# Patient Record
Sex: Female | Born: 1970 | Race: Black or African American | Hispanic: No | Marital: Single | Smoking: Never smoker
Health system: Southern US, Community
[De-identification: ages and names within clinical notes are randomized; demographics above are authoritative.]

## PROBLEM LIST (undated history)

## (undated) DIAGNOSIS — F419 Anxiety disorder, unspecified: Secondary | ICD-10-CM

## (undated) DIAGNOSIS — T7840XA Allergy, unspecified, initial encounter: Secondary | ICD-10-CM

## (undated) DIAGNOSIS — D241 Benign neoplasm of right breast: Secondary | ICD-10-CM

## (undated) DIAGNOSIS — S80211A Abrasion, right knee, initial encounter: Secondary | ICD-10-CM

## (undated) DIAGNOSIS — I10 Essential (primary) hypertension: Secondary | ICD-10-CM

## (undated) DIAGNOSIS — K635 Polyp of colon: Secondary | ICD-10-CM

## (undated) DIAGNOSIS — M26629 Arthralgia of temporomandibular joint, unspecified side: Secondary | ICD-10-CM

## (undated) HISTORY — DX: Allergy, unspecified, initial encounter: T78.40XA

## (undated) HISTORY — DX: Anxiety disorder, unspecified: F41.9

## (undated) HISTORY — DX: Polyp of colon: K63.5

## (undated) HISTORY — DX: Essential (primary) hypertension: I10

## (undated) HISTORY — PX: TOE SURGERY: SHX1073

---

## 2000-08-25 ENCOUNTER — Other Ambulatory Visit: Admission: RE | Admit: 2000-08-25 | Discharge: 2000-08-25 | Payer: Self-pay | Admitting: Obstetrics and Gynecology

## 2001-03-04 ENCOUNTER — Emergency Department (HOSPITAL_COMMUNITY): Admission: EM | Admit: 2001-03-04 | Discharge: 2001-03-04 | Payer: Self-pay | Admitting: Emergency Medicine

## 2001-08-09 ENCOUNTER — Other Ambulatory Visit: Admission: RE | Admit: 2001-08-09 | Discharge: 2001-08-09 | Payer: Self-pay | Admitting: Obstetrics and Gynecology

## 2002-08-15 ENCOUNTER — Other Ambulatory Visit: Admission: RE | Admit: 2002-08-15 | Discharge: 2002-08-15 | Payer: Self-pay | Admitting: Obstetrics and Gynecology

## 2004-03-03 ENCOUNTER — Ambulatory Visit: Payer: Self-pay | Admitting: Pediatrics

## 2004-03-03 ENCOUNTER — Inpatient Hospital Stay (HOSPITAL_COMMUNITY): Admission: AD | Admit: 2004-03-03 | Discharge: 2004-03-12 | Payer: Self-pay | Admitting: Obstetrics and Gynecology

## 2004-03-08 ENCOUNTER — Encounter (INDEPENDENT_AMBULATORY_CARE_PROVIDER_SITE_OTHER): Payer: Self-pay | Admitting: Specialist

## 2004-03-13 ENCOUNTER — Encounter: Admission: RE | Admit: 2004-03-13 | Discharge: 2004-04-12 | Payer: Self-pay | Admitting: Obstetrics and Gynecology

## 2004-03-14 ENCOUNTER — Inpatient Hospital Stay (HOSPITAL_COMMUNITY): Admission: AD | Admit: 2004-03-14 | Discharge: 2004-03-17 | Payer: Self-pay | Admitting: Obstetrics and Gynecology

## 2004-04-13 ENCOUNTER — Encounter: Admission: RE | Admit: 2004-04-13 | Discharge: 2004-05-13 | Payer: Self-pay | Admitting: Obstetrics and Gynecology

## 2008-01-25 ENCOUNTER — Emergency Department (HOSPITAL_COMMUNITY): Admission: EM | Admit: 2008-01-25 | Discharge: 2008-01-25 | Payer: Self-pay | Admitting: Emergency Medicine
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
## 2010-07-24 NOTE — Discharge Summary (Signed)
[name], [name]              ACCOUNT NO.:  [id number]   MEDICAL RECORD NO.:  [id number]          PATIENT TYPE:  INP   LOCATION:  9316                          FACILITY:  [facility]   PHYSICIAN:  Lenoard Aden, M.D.DATE OF BIRTH:  11-16-1970   DATE OF ADMISSION:  03/03/2004  DATE OF DISCHARGE:  03/12/2004                                 DISCHARGE SUMMARY   The patient was admitted on March 03, 2004, for mild preeclampsia at 32+  weeks.   HOSPITAL COURSE:  She was managed expectantly with serial labs and fetal  heart rate monitoring with reassuring surveillance through the initial  course of her hospitalization.  On hospital day #6, it was noted that the  patient met criteria for severe preeclampsia based on 24-hour urine and  exacerbation of her liver function tests and blood pressure criteria.  Therefore, due to an unfavorable cervix and severe preeclampsia, primary  cesarean section was performed on March 08, 2004.  Postoperatively, the  patient's urine output improved on magnesium.  Blood pressures are slightly  labile and the patient was placed on labetalol.  She was discharged to home  on post day #4 after improvement of her blood pressure noted.   DISCHARGE MEDICATIONS:  1.  Hydralazine.  2.  Labetalol.  3.  Prenatal vitamins.  4.  Iron.  5.  Tylox.   FOLLOW UP:  She is to follow up in the office within one week for blood  pressure check, strict hypertensive precautions were given.  Discharge  teaching was done.   CONDITION ON DISCHARGE:  Stable.      RJT/MEDQ  D:  04/13/2004  T:  04/13/2004  Job:  [id number]

## 2010-07-24 NOTE — H&P (Signed)
NAMESAYLOR, [name]              ACCOUNT NO.:  [id number]   MEDICAL RECORD NO.:  [id number]          PATIENT TYPE:  INP   LOCATION:  9374                          FACILITY:  [facility]   PHYSICIAN:  Richardean Sale, M.D.   DATE OF BIRTH:  [birth year]   DATE OF ADMISSION:  03/14/2004  DATE OF DISCHARGE:                                HISTORY & PHYSICAL   ADMISSION DIAGNOSES:  Postpartum hypertension.   HISTORY OF PRESENT ILLNESS:  This is a 40 year old African-American female  gravida 1, para 1 who is status post primary cesarean section on March 08, 2004 for severe pre-eclampsia at [redacted] weeks gestation. The patient received  magnesium sulfate therapy for 24 hours after delivery. Blood pressures  improved. She was discharged to home on March 12, 2004 with prescription  for Labetalol and hydralazine p.o. The patient has been recording her blood  pressures at home and they have been elevated in the 160's to 180's over  100's to 1'teen's. The patient complains of mild headache at present. Denies  any visual changes or blurred vision. Denies any epigastric pain. Denies any  chest pain or shortness of breath.   PAST MEDICAL HISTORY:  No chronic illnesses.   PAST SURGICAL HISTORY:  Wisdom teeth and foot surgery.   OBSTETRIC HISTORY:  Status post cesarean section on March 08, 2004 for  severe pre-eclampsia at 33 weeks, which resulted in delivery of a viable  female infant who is currently at the NICU here at [facility].   GYNECOLOGIC HISTORY:  No history of abnormal pap smears or sexually  transmitted infections.   FAMILY HISTORY:  Significant for father with myocardial infarction, aunt  with multiple sclerosis, a grandparent with diabetes.   MEDICATIONS:  Labetalol 100 mg p.o. t.i.d., hydralazine 10 mg p.o. q.i.d.,  prenatal vitamin daily, Motrin and Percocet as needed.   ALLERGIES:  No known drug allergies.   PHYSICAL EXAMINATION:  VITAL SIGNS:  Blood pressure on admission  197/117,  repeat 199/117. Heart rate 84, respiratory rate 20, temperature 98.2.  GENERAL:  A well developed, well nourished, black female who appears in no  acute distress.  HEENT:  There is mild periorbital edema.  HEART:  Regular rate and rhythm. A 3 out of 6 systolic ejection murmur.  LUNGS:  Clear to auscultation bilaterally.  ABDOMEN:  Soft, nontender, and nondistended with no palpable masses. Liver  and spleen are normal.  EXTREMITIES:  2+ edema in the feet bilaterally. Nontender. Deep tendon  reflexes are 3 to 4+ bilaterally with 2 beats of clonus.   ASSESSMENT:  A 40 year old gravida 1, para 1, black female status post  primary cesarean section at 4 weeks on March 08, 2004 for severe pre-  eclampsia, now with worsening hypertension, headache, hyper-reflexia.  Clinical picture consistent with post partum pre-eclampsia.   PLAN:  Will admit to AICU. Start magnesium sulfate therapy for seizure  prophylaxis. Will administer intravenous antihypertensive. Monitor strict  urine output. Check pre-eclampsia labs and urine for protein. Reviewed with  patient and husband, increased risk of seizure and stroke with the same  elevated hypertension and  the need for hospitalization. The patient has  voiced understanding of the above plan. All questions answered.     Jenn   JW/MEDQ  D:  03/14/2004  T:  03/14/2004  Job:  [id number]

## 2010-07-24 NOTE — Op Note (Signed)
NAMELILLYEN, SCHOW              ACCOUNT NO.:  [id number]   MEDICAL RECORD NO.:  [id number]          PATIENT TYPE:  INP   LOCATION:  9154                          FACILITY:  [facility]   PHYSICIAN:  Lenoard Aden, M.D.DATE OF BIRTH:  [birth year]   DATE OF PROCEDURE:  03/09/2003  DATE OF DISCHARGE:                                 OPERATIVE REPORT   PREOPERATIVE DIAGNOSES:  1.  Severe preeclampsia.  2.  A 33-3/7 weeks intrauterine pregnancy.   POSTOPERATIVE DIAGNOSES:  1.  Severe preeclampsia.  2.  A 33-3/7 weeks intrauterine pregnancy.   PROCEDURE:  Primary LSTCS.   SURGEON:  Lenoard Aden, M.D.   ANESTHESIA:  Spinal by __________.   ESTIMATED BLOOD LOSS:  1000 mL.   COMPLICATIONS:  None.   DRAINS:  Foley.   COUNTS:  Correct.  The patient in recovery in good condition.  Magnesium sulfate to be started  in the recovery room.   FINDINGS:  Female fetus, to the NICU, Apgars 8 and 9.  Pediatricians in  attendance.  Placenta anteriorly located, delivered manually, intact.  Three-  vessel cord noted.  Bulb suctioning performed.  Normal tubes and normal  ovaries noted.   After achieving adequate anesthesia, the patient brought to the operating  room where she was administered a spinal anesthetic without complications,  prepped and draped in the usual sterile fashion, a Foley catheter placed.  After achieving adequate anesthesia, dilute Marcaine solution placed, 10 mL  total, Pfannenstiel skin incision made with the scalpel, carried down to the  fascia which is nicked in the midline, opened transversely using Mayo  scissors.  Rectus muscle is dissected sharply, peritoneum entered sharply,  bladder blade placed.  Visceroperitoneum is scored in a smile-like fashion,  dissected sharply off the lower uterine segment.  Kerr hysterotomy incision  made, atraumatically delivery of a female fetus, Apgars 8 and 9, pediatrics in  attendance, cord blood collected, cord pH collected 7.32.   Bulb suctioning  performed as noted.  Nuchal cord x 1, reduced prior to delivery.  Uterus is  curetted using a dry lap pack and closed in two layers using a 0 Monocryl  suture, and normal tubes and ovaries as previously noted.  Irrigation  accomplished.  Bladder flap is inspected and found to be hemostatic.  Two  interrupted sutures  placed in the midline of the uterine incision to obtain better hemostasis.  Interrupted figure-of-eight sutures.  At this time, good hemostasis is  noted.  Fascia has been closed with a 0 Monocryl in continuous running  fashion.  Skin closed using staples.  The patient tolerates the procedure  well and is transferred to recovery in good condition.      RJT/MEDQ  D:  03/08/2004  T:  03/08/2004  Job:  [id number]

## 2010-07-24 NOTE — Discharge Summary (Signed)
[name], [name]              ACCOUNT NO.:  [id number]   MEDICAL RECORD NO.:  [id number]          PATIENT TYPE:  INP   LOCATION:  9107                          FACILITY:  [facility]   PHYSICIAN:  Richardean Sale, M.D.   DATE OF BIRTH:  [birth year]   DATE OF ADMISSION:  03/14/2004  DATE OF DISCHARGE:                                 DISCHARGE SUMMARY   ADMITTING DIAGNOSIS:  Postpartum preeclampsia.   DISCHARGE DIAGNOSIS:  Postpartum preeclampsia.   HOSPITAL COURSE/HISTORY OF PRESENT ILLNESS:  This is a 40 year old African-  American female who is status post primary cesarean section on March 08, 2004 for severe preeclampsia at 33 weeks.  The patient received magnesium  sulfate therapy 24 hours after delivery and was discharged to home on  postoperative day #4 on labetalol and hydralazine to control hypertension.  The patient was monitoring her blood pressures at home.  She had blood  pressure elevations in the 180s over 120s and represented to [facility] on Saturday, January 7 complaining of headache.  Blood pressure on  admission was 197/117.  Given the significant hypertension, the patient was  readmitted to the AICU and started back on magnesium sulfate therapy.  She  also received intravenous labetalol to control significant hypertension.  After 24 hours of magnesium sulfate therapy the patient had had an adequate  diuresis, her headache had improved, and her blood pressures were less  labile.  She was continued on oral labetalol which was increased to 300 mg  p.o. t.i.d., and hydrochlorothiazide 25 mg p.o. daily was added.  Preeclampsia labs were drawn during this admission.  Her liver function  tests were mildly elevated with an AST of 42 and ALT of 59 but these were  decreased compared to her previous admission.  Uric acid was 6.5, LDH 145,  platelet count 387.  On hospital day #4 the patient's blood pressures were  significantly improved, running in the 130s over 80s.  She  had no headache,  visual changes, or epigastric pain and was subsequently discharged to home.   DISPOSITION:  To home.   CONDITION:  Improved.   MEDICATIONS:  1.  Labetalol 300 mg p.o. t.i.d.  2.  Hydrochlorothiazide 25 mg p.o. daily.  3.  Tylox one to two tablets p.o. q.4-6h. p.r.n. pain.  4.  Motrin 800 mg p.o. q.8h. p.r.n. pain.   INSTRUCTIONS:  The patient is to monitor her blood pressure daily.  She is  to call for blood pressures greater than 170/100 or less than 90/60, or if  she develops any headache, visual changes, or epigastric pain.  She is to  return to the office in 2-3 days for a blood pressure check.     Jenn   JW/MEDQ  D:  03/17/2004  T:  03/17/2004  Job:  [id number]

## 2015-12-24 ENCOUNTER — Other Ambulatory Visit: Payer: Self-pay | Admitting: Radiology

## 2016-01-09 ENCOUNTER — Ambulatory Visit: Payer: Self-pay | Admitting: General Surgery

## 2016-01-09 DIAGNOSIS — D241 Benign neoplasm of right breast: Secondary | ICD-10-CM

## 2016-02-06 DIAGNOSIS — D241 Benign neoplasm of right breast: Secondary | ICD-10-CM

## 2016-02-06 HISTORY — DX: Benign neoplasm of right breast: D24.1

## 2016-03-04 ENCOUNTER — Encounter (HOSPITAL_BASED_OUTPATIENT_CLINIC_OR_DEPARTMENT_OTHER): Payer: Self-pay | Admitting: *Deleted

## 2016-03-04 DIAGNOSIS — S80211A Abrasion, right knee, initial encounter: Secondary | ICD-10-CM

## 2016-03-04 HISTORY — DX: Abrasion, right knee, initial encounter: S80.211A

## 2016-03-10 ENCOUNTER — Ambulatory Visit (HOSPITAL_BASED_OUTPATIENT_CLINIC_OR_DEPARTMENT_OTHER): Payer: BLUE CROSS/BLUE SHIELD | Admitting: Certified Registered"

## 2016-03-10 ENCOUNTER — Encounter (HOSPITAL_BASED_OUTPATIENT_CLINIC_OR_DEPARTMENT_OTHER): Payer: Self-pay | Admitting: Certified Registered"

## 2016-03-10 ENCOUNTER — Ambulatory Visit (HOSPITAL_BASED_OUTPATIENT_CLINIC_OR_DEPARTMENT_OTHER)
Admission: RE | Admit: 2016-03-10 | Discharge: 2016-03-10 | Disposition: A | Discharge status: Home / Self Care | Payer: BLUE CROSS/BLUE SHIELD | Source: Ambulatory Visit | Attending: General Surgery | Admitting: General Surgery

## 2016-03-10 ENCOUNTER — Encounter (HOSPITAL_BASED_OUTPATIENT_CLINIC_OR_DEPARTMENT_OTHER)
Admission: RE | Disposition: A | Discharge status: Home / Self Care | Payer: Self-pay | Source: Ambulatory Visit | Attending: General Surgery

## 2016-03-10 DIAGNOSIS — D241 Benign neoplasm of right breast: Secondary | ICD-10-CM | POA: Insufficient documentation

## 2016-03-10 DIAGNOSIS — N63 Unspecified lump in unspecified breast: Secondary | ICD-10-CM | POA: Diagnosis present

## 2016-03-10 HISTORY — DX: Arthralgia of temporomandibular joint, unspecified side: M26.629

## 2016-03-10 HISTORY — DX: Abrasion, right knee, initial encounter: S80.211A

## 2016-03-10 HISTORY — PX: BREAST LUMPECTOMY WITH RADIOACTIVE SEED LOCALIZATION: SHX6424

## 2016-03-10 HISTORY — DX: Benign neoplasm of right breast: D24.1

## 2016-03-10 SURGERY — BREAST LUMPECTOMY WITH RADIOACTIVE SEED LOCALIZATION
Anesthesia: General | Site: Breast | Laterality: Right

## 2016-03-10 MED ORDER — DEXAMETHASONE SODIUM PHOSPHATE 10 MG/ML IJ SOLN
INTRAMUSCULAR | Status: AC
  Filled 2016-03-10: qty 1

## 2016-03-10 MED ORDER — ONDANSETRON HCL 4 MG/2ML IJ SOLN: 4.0000 mg | Freq: Once | INTRAMUSCULAR | Status: DC | PRN

## 2016-03-10 MED ORDER — LIDOCAINE 2% (20 MG/ML) 5 ML SYRINGE
INTRAMUSCULAR | Status: DC | PRN
  Administered 2016-03-10: 100 mg via INTRAVENOUS

## 2016-03-10 MED ORDER — BUPIVACAINE HCL (PF) 0.25 % IJ SOLN
INTRAMUSCULAR | Status: DC | PRN
  Administered 2016-03-10: 20 mL

## 2016-03-10 MED ORDER — CEFAZOLIN SODIUM-DEXTROSE 2-4 GM/100ML-% IV SOLN: 2.0000 g | INTRAVENOUS | Status: DC

## 2016-03-10 MED ORDER — LIDOCAINE 2% (20 MG/ML) 5 ML SYRINGE
INTRAMUSCULAR | Status: AC
  Filled 2016-03-10: qty 5

## 2016-03-10 MED ORDER — PROPOFOL 10 MG/ML IV BOLUS
INTRAVENOUS | Status: AC
  Filled 2016-03-10: qty 20

## 2016-03-10 MED ORDER — MEPERIDINE HCL 25 MG/ML IJ SOLN: 6.2500 mg | INTRAMUSCULAR | Status: DC | PRN

## 2016-03-10 MED ORDER — CEFAZOLIN SODIUM-DEXTROSE 2-4 GM/100ML-% IV SOLN
INTRAVENOUS | Status: AC
  Filled 2016-03-10: qty 100

## 2016-03-10 MED ORDER — CHLORHEXIDINE GLUCONATE CLOTH 2 % EX PADS: 6.0000 | MEDICATED_PAD | Freq: Once | CUTANEOUS | Status: DC

## 2016-03-10 MED ORDER — PROPOFOL 10 MG/ML IV BOLUS
INTRAVENOUS | Status: DC | PRN
  Administered 2016-03-10: 100 mg via INTRAVENOUS
  Administered 2016-03-10: 50 mg via INTRAVENOUS

## 2016-03-10 MED ORDER — SUCCINYLCHOLINE CHLORIDE 20 MG/ML IJ SOLN
INTRAMUSCULAR | Status: DC | PRN
  Administered 2016-03-10: 80 mg via INTRAVENOUS

## 2016-03-10 MED ORDER — HYDROCODONE-ACETAMINOPHEN 5-325 MG PO TABS: 1.0000 | ORAL_TABLET | ORAL | 0 refills | Status: DC | PRN

## 2016-03-10 MED ORDER — ONDANSETRON HCL 4 MG/2ML IJ SOLN
INTRAMUSCULAR | Status: DC | PRN
  Administered 2016-03-10: 4 mg via INTRAVENOUS

## 2016-03-10 MED ORDER — SCOPOLAMINE 1 MG/3DAYS TD PT72: 1.0000 | MEDICATED_PATCH | Freq: Once | TRANSDERMAL | Status: DC | PRN

## 2016-03-10 MED ORDER — DEXAMETHASONE SODIUM PHOSPHATE 4 MG/ML IJ SOLN
INTRAMUSCULAR | Status: DC | PRN
  Administered 2016-03-10: 10 mg via INTRAVENOUS

## 2016-03-10 MED ORDER — MIDAZOLAM HCL 2 MG/2ML IJ SOLN
1.0000 mg | INTRAMUSCULAR | Status: DC | PRN
  Administered 2016-03-10: 2 mg via INTRAVENOUS

## 2016-03-10 MED ORDER — ONDANSETRON HCL 4 MG/2ML IJ SOLN
INTRAMUSCULAR | Status: AC
  Filled 2016-03-10: qty 2

## 2016-03-10 MED ORDER — LACTATED RINGERS IV SOLN
INTRAVENOUS | Status: DC
  Administered 2016-03-10: 13:00:00 via INTRAVENOUS

## 2016-03-10 MED ORDER — HYDROMORPHONE HCL 1 MG/ML IJ SOLN: 0.2500 mg | INTRAMUSCULAR | Status: DC | PRN

## 2016-03-10 MED ORDER — MIDAZOLAM HCL 2 MG/2ML IJ SOLN
INTRAMUSCULAR | Status: AC
  Filled 2016-03-10: qty 2

## 2016-03-10 MED ORDER — FENTANYL CITRATE (PF) 100 MCG/2ML IJ SOLN
INTRAMUSCULAR | Status: AC
  Filled 2016-03-10: qty 2

## 2016-03-10 MED ORDER — FENTANYL CITRATE (PF) 100 MCG/2ML IJ SOLN
50.0000 ug | INTRAMUSCULAR | Status: DC | PRN
  Administered 2016-03-10: 100 ug via INTRAVENOUS

## 2016-03-10 SURGICAL SUPPLY — 46 items
ADH SKN CLS APL DERMABOND .7 (GAUZE/BANDAGES/DRESSINGS) ×1
APPLIER CLIP 9.375 MED OPEN (MISCELLANEOUS)
APR CLP MED 9.3 20 MLT OPN (MISCELLANEOUS)
BLADE SURG 15 STRL LF DISP TIS (BLADE) ×1 IMPLANT
BLADE SURG 15 STRL SS (BLADE) ×3
CANISTER SUC SOCK COL 7IN (MISCELLANEOUS) ×1 IMPLANT
CANISTER SUCT 1200ML W/VALVE (MISCELLANEOUS) ×1 IMPLANT
CHLORAPREP W/TINT 26ML (MISCELLANEOUS) ×3 IMPLANT
CLIP APPLIE 9.375 MED OPEN (MISCELLANEOUS) IMPLANT
COVER BACK TABLE 60X90IN (DRAPES) ×3 IMPLANT
COVER MAYO STAND STRL (DRAPES) ×3 IMPLANT
COVER PROBE W GEL 5X96 (DRAPES) ×3 IMPLANT
DECANTER SPIKE VIAL GLASS SM (MISCELLANEOUS) IMPLANT
DERMABOND ADVANCED (GAUZE/BANDAGES/DRESSINGS) ×2
DERMABOND ADVANCED .7 DNX12 (GAUZE/BANDAGES/DRESSINGS) ×1 IMPLANT
DEVICE DUBIN W/COMP PLATE 8390 (MISCELLANEOUS) ×3 IMPLANT
DRAPE LAPAROSCOPIC ABDOMINAL (DRAPES) IMPLANT
DRAPE UTILITY XL STRL (DRAPES) ×3 IMPLANT
ELECT COATED BLADE 2.86 ST (ELECTRODE) ×3 IMPLANT
ELECT REM PT RETURN 9FT ADLT (ELECTROSURGICAL) ×3
ELECTRODE REM PT RTRN 9FT ADLT (ELECTROSURGICAL) ×1 IMPLANT
GLOVE BIO SURGEON STRL SZ7.5 (GLOVE) ×6 IMPLANT
GLOVE BIOGEL PI IND STRL 7.0 (GLOVE) IMPLANT
GLOVE BIOGEL PI INDICATOR 7.0 (GLOVE) ×4
GLOVE ECLIPSE 6.5 STRL STRAW (GLOVE) ×2 IMPLANT
GOWN STRL REUS W/ TWL LRG LVL3 (GOWN DISPOSABLE) ×2 IMPLANT
GOWN STRL REUS W/TWL LRG LVL3 (GOWN DISPOSABLE) ×6
ILLUMINATOR WAVEGUIDE N/F (MISCELLANEOUS) IMPLANT
KIT MARKER MARGIN INK (KITS) ×3 IMPLANT
LIGHT WAVEGUIDE WIDE FLAT (MISCELLANEOUS) IMPLANT
NDL HYPO 25X1 1.5 SAFETY (NEEDLE) IMPLANT
NEEDLE HYPO 25X1 1.5 SAFETY (NEEDLE) ×3 IMPLANT
NS IRRIG 1000ML POUR BTL (IV SOLUTION) IMPLANT
PACK BASIN DAY SURGERY FS (CUSTOM PROCEDURE TRAY) ×3 IMPLANT
PENCIL BUTTON HOLSTER BLD 10FT (ELECTRODE) ×3 IMPLANT
SLEEVE SCD COMPRESS KNEE MED (MISCELLANEOUS) ×3 IMPLANT
SPONGE LAP 18X18 X RAY DECT (DISPOSABLE) ×3 IMPLANT
SUT MON AB 4-0 PC3 18 (SUTURE) ×2 IMPLANT
SUT SILK 2 0 SH (SUTURE) IMPLANT
SUT VICRYL 3-0 CR8 SH (SUTURE) ×3 IMPLANT
SYR CONTROL 10ML LL (SYRINGE) ×2 IMPLANT
TOWEL OR 17X24 6PK STRL BLUE (TOWEL DISPOSABLE) ×3 IMPLANT
TOWEL OR NON WOVEN STRL DISP B (DISPOSABLE) ×1 IMPLANT
TUBE CONNECTING 20'X1/4 (TUBING)
TUBE CONNECTING 20X1/4 (TUBING) ×1 IMPLANT
YANKAUER SUCT BULB TIP NO VENT (SUCTIONS) IMPLANT

## 2016-03-10 NOTE — Interval H&P Note (Signed)
History and Physical Interval Note:  [id number] PM  [name]  has presented today for surgery, with the diagnosis of RIGHT BREAST PAPILLOMA  The various methods of treatment have been discussed with the patient and family. After consideration of risks, benefits and other options for treatment, the patient has consented to  Procedure(s): RIGHT BREAST LUMPECTOMY WITH RADIOACTIVE SEED LOCALIZATION (Right) as a surgical intervention .  The patient's history has been reviewed, patient examined, no change in status, stable for surgery.  I have reviewed the patient's chart and labs.  Questions were answered to the patient's satisfaction.     TOTH III,PAUL S

## 2016-03-10 NOTE — Op Note (Signed)
03/10/2016  2:30 PM  PATIENT:  [name]  46 y.o. female  PRE-OPERATIVE DIAGNOSIS:  RIGHT BREAST PAPILLOMA  POST-OPERATIVE DIAGNOSIS:  RIGHT BREAST PAPILLOMA  PROCEDURE:  Procedure(s): RIGHT BREAST LUMPECTOMY WITH RADIOACTIVE SEED LOCALIZATION (Right)  SURGEON:  Surgeon(s) and Role:    * Jovita Kussmaul, MD - Primary  PHYSICIAN ASSISTANT:   ASSISTANTS: none   ANESTHESIA:   local and general  EBL:  Total I/O In: 200 [I.V.:200] Out: 15 [Blood:15]  BLOOD ADMINISTERED:none  DRAINS: none   LOCAL MEDICATIONS USED:  MARCAINE     SPECIMEN:  Source of Specimen:  right breast tissue  DISPOSITION OF SPECIMEN:  PATHOLOGY  COUNTS:  YES  TOURNIQUET:  * No tourniquets in log *  DICTATION: .Dragon Dictation   After informed consent was obtained the patient was brought to the operating room and placed in the supine position on the operating room table. After adequate induction of general anesthesia the patient's right breast was prepped with ChloraPrep, allowed to dry, and draped in usual sterile manner. An appropriate timeout was performed. Previously an I-125 seed was placed in the lower outer subareolar area of the right breast to mark an area of an intraductal papilloma. The neoprobe was sent to I-125 in the area of radioactivity was readily identified in the lower outer subareolar area of the right breast. A curvilinear incision was made along the lower outer edge of the areola with a 15 blade knife. The incision was carried through the subcutaneous tissue sharply with the electrocautery. Skin hooks were used to retract the skin edges radially. While checking the area of radioactivity frequently with the neoprobe a circular portion of breast tissue was excised sharply around the radioactive seed with the electrocautery. Once the specimen was removed it was oriented with the appropriate paint colors. A specimen radiograph was obtained that showed the clip and seed to be in the center  of the specimen. The specimen was then sent to pathology for further evaluation. The wound was infiltrated with quarter percent Marcaine. Hemostasis was achieved using the Bovie electrocautery. The deep layer of the wound was then closed with layers of interrupted 3-0 Vicryl stitches. Skin was then closed with interrupted 4-0 Monocryl subcuticular stitches. Dermabond dressings were applied. The patient tolerated the procedure well. At the end of the case all needle sponge and instrument counts were correct. The patient was then awakened and taken to recovery in stable condition.  PLAN OF CARE: Discharge to home after PACU  PATIENT DISPOSITION:  PACU - hemodynamically stable.   Delay start of Pharmacological VTE agent (>24hrs) due to surgical blood loss or risk of bleeding: not applicable

## 2016-03-10 NOTE — Transfer of Care (Signed)
Immediate Anesthesia Transfer of Care Note  Patient: [name]  Procedure(s) Performed: Procedure(s): RIGHT BREAST LUMPECTOMY WITH RADIOACTIVE SEED LOCALIZATION (Right)  Patient Location: PACU  Anesthesia Type:General  Level of Consciousness: sedated  Airway & Oxygen Therapy: Patient Spontanous Breathing and Patient connected to face mask oxygen  Post-op Assessment: Report given to RN and Post -op Vital signs reviewed and stable  Post vital signs: Reviewed and stable  Last Vitals:  Vitals:   03/10/16 1440 03/10/16 1441  BP:    Pulse: 98 95  Resp: (!) 21 15  Temp: 36.6 C     Last Pain:  Vitals:   03/10/16 1252  TempSrc: Oral         Complications: No apparent anesthesia complications

## 2016-03-10 NOTE — Anesthesia Postprocedure Evaluation (Signed)
Anesthesia Post Note  Patient: [name]  Procedure(s) Performed: Procedure(s) (LRB): RIGHT BREAST LUMPECTOMY WITH RADIOACTIVE SEED LOCALIZATION (Right)  Patient location during evaluation: PACU Anesthesia Type: General Level of consciousness: awake and alert Pain management: pain level controlled Vital Signs Assessment: post-procedure vital signs reviewed and stable Respiratory status: spontaneous breathing, nonlabored ventilation, respiratory function stable and patient connected to nasal cannula oxygen Cardiovascular status: blood pressure returned to baseline and stable Postop Assessment: no signs of nausea or vomiting Anesthetic complications: no       Last Vitals:  Vitals:   03/10/16 1441 03/10/16 1445  BP:  (!) 134/95  Pulse: 95 86  Resp: 15 14  Temp:      Last Pain:  Vitals:   03/10/16 1445  TempSrc:   PainSc: 0-No pain                 [name] DAVID

## 2016-03-10 NOTE — Anesthesia Procedure Notes (Signed)
Procedure Name: Intubation Date/Time: 03/10/2016 1:46 PM Performed by: Maryella Shivers Pre-anesthesia Checklist: Patient identified, Emergency Drugs available, Suction available and Patient being monitored Patient Re-evaluated:Patient Re-evaluated prior to inductionOxygen Delivery Method: Circle system utilized Preoxygenation: Pre-oxygenation with 100% oxygen Intubation Type: IV induction Ventilation: Mask ventilation without difficulty Laryngoscope Size: Mac and 3 Grade View: Grade II Tube type: Oral Tube size: 7.0 mm Number of attempts: 1 Airway Equipment and Method: Stylet and Oral airway Placement Confirmation: ETT inserted through vocal cords under direct vision,  positive ETCO2 and breath sounds checked- equal and bilateral Secured at: 20 cm Tube secured with: Tape Dental Injury: Teeth and Oropharynx as per pre-operative assessment  Comments: Unable to seat LMA, pt. intubated

## 2016-03-10 NOTE — Discharge Instructions (Signed)

## 2016-03-10 NOTE — H&P (Signed)
[name]  Location: [facility] Patient #: [id number] DOB: [birth year] Married / Language: English / Race: Black or African American Female   History of Present Illness  The patient is a 46 year old female who presents with a breast mass. We are asked to see the patient in consultation by Dr. Isaiah Blakes to evaluate her for a right breast intraductal papilloma. The patient is a 46 year old black female who recently went for a routine screening mammogram. At that time she was found to have an area of distortion in the 9:00 subareolar right breast. This was biopsied and came back as an intraductal papilloma. She denies any breast pain or discharge from the nipple. She does not take any hormone replacement. She has no personal or family history of breast cancer.   Other Problems  High blood pressure   Past Surgical History  Cesarean Section - 1   Diagnostic Studies History Colonoscopy  never Mammogram  within last year Pap Smear  1-5 years ago  Allergies  No Known Drug Allergies   Medication History  No Current Medications Medications Reconciled  Social History  Alcohol use  Occasional alcohol use. Caffeine use  Carbonated beverages, Coffee, Tea. No drug use  Tobacco use  Never smoker.  Family History  Arthritis  Father, Mother. Colon Polyps  Father. Heart Disease  Father. Hypertension  Father. Melanoma  Family Members In General.  Pregnancy / Birth History  Age at menarche  56 years. Age of menopause  <44 Contraceptive History  Oral contraceptives. Gravida  1 Irregular periods  Maternal age  8-35 Para  0    Review of Systems  General Not Present- Appetite Loss, Chills, Fatigue, Fever, Night Sweats, Weight Gain and Weight Loss. Skin Not Present- Change in Wart/Mole, Dryness, Hives, Jaundice, New Lesions, Non-Healing Wounds, Rash and Ulcer. HEENT Not Present- Earache, Hearing Loss, Hoarseness, Nose Bleed, Oral Ulcers,  Ringing in the Ears, Seasonal Allergies, Sinus Pain, Sore Throat, Visual Disturbances, Wears glasses/contact lenses and Yellow Eyes. Respiratory Not Present- Bloody sputum, Chronic Cough, Difficulty Breathing, Snoring and Wheezing. Breast Present- Breast Mass. Not Present- Breast Pain, Nipple Discharge and Skin Changes. Cardiovascular Not Present- Chest Pain, Difficulty Breathing Lying Down, Leg Cramps, Palpitations, Rapid Heart Rate, Shortness of Breath and Swelling of Extremities. Gastrointestinal Not Present- Abdominal Pain, Bloating, Bloody Stool, Change in Bowel Habits, Chronic diarrhea, Constipation, Difficulty Swallowing, Excessive gas, Gets full quickly at meals, Hemorrhoids, Indigestion, Nausea, Rectal Pain and Vomiting. Female Genitourinary Not Present- Frequency, Nocturia, Painful Urination, Pelvic Pain and Urgency. Musculoskeletal Not Present- Back Pain, Joint Pain, Joint Stiffness, Muscle Pain, Muscle Weakness and Swelling of Extremities. Neurological Not Present- Decreased Memory, Fainting, Headaches, Numbness, Seizures, Tingling, Tremor, Trouble walking and Weakness. Psychiatric Not Present- Anxiety, Bipolar, Change in Sleep Pattern, Depression, Fearful and Frequent crying. Endocrine Not Present- Cold Intolerance, Excessive Hunger, Hair Changes, Heat Intolerance, Hot flashes and New Diabetes. Hematology Not Present- Blood Thinners, Easy Bruising, Excessive bleeding, Gland problems, HIV and Persistent Infections.  Vitals  Weight: 140 lb Height: 61.5in Body Surface Area: 1.63 m Body Mass Index: 26.02 kg/m  Pulse: 77 (Regular)  BP: 126/74 (Sitting, Left Arm, Standard)       Physical Exam  General Mental Status-Alert. General Appearance-Consistent with stated age. Hydration-Well hydrated. Voice-Normal.  Head and Neck Head-normocephalic, atraumatic with no lesions or palpable masses. Trachea-midline. Thyroid Gland Characteristics - normal size and  consistency.  Eye Eyeball - Bilateral-Extraocular movements intact. Sclera/Conjunctiva - Bilateral-No scleral icterus.  Chest and Lung Exam Chest  and lung exam reveals -quiet, even and easy respiratory effort with no use of accessory muscles and on auscultation, normal breath sounds, no adventitious sounds and normal vocal resonance. Inspection Chest Wall - Normal. Back - normal.  Breast Note: There is a small palpable bruising in the lower outer subareolar right breast. There is no palpable mass in the left breast. There is no palpable axillary, supraclavicular, or cervical lymphadenopathy.   Cardiovascular Cardiovascular examination reveals -normal heart sounds, regular rate and rhythm with no murmurs and normal pedal pulses bilaterally.  Abdomen Inspection Inspection of the abdomen reveals - No Hernias. Skin - Scar - no surgical scars. Palpation/Percussion Palpation and Percussion of the abdomen reveal - Soft, Non Tender, No Rebound tenderness, No Rigidity (guarding) and No hepatosplenomegaly. Auscultation Auscultation of the abdomen reveals - Bowel sounds normal.  Neurologic Neurologic evaluation reveals -alert and oriented x 3 with no impairment of recent or remote memory. Mental Status-Normal.  Musculoskeletal Normal Exam - Left-Upper Extremity Strength Normal and Lower Extremity Strength Normal. Normal Exam - Right-Upper Extremity Strength Normal and Lower Extremity Strength Normal.  Lymphatic Head & Neck  General Head & Neck Lymphatics: Bilateral - Description - Normal. Axillary  General Axillary Region: Bilateral - Description - Normal. Tenderness - Non Tender. Femoral & Inguinal  Generalized Femoral & Inguinal Lymphatics: Bilateral - Description - Normal. Tenderness - Non Tender.    Assessment & Plan  INTRADUCTAL PAPILLOMA OF BREAST, RIGHT (D24.1) Impression: The patient appears to have an intraductal papilloma of the subareolar right  lateral breast. Although the papilloma is benign because of its appearance on mammogram I think it would be reasonable to remove it. She would also like to have this done. I have discussed with her in detail the risks and benefits of the operation to remove this area as well as some of the technical aspects and she understands and wishes to proceed. I will plan for a right breast radioactive seed localized lumpectomy. Current Plans Pt Education - Breast Diseases: discussed with patient and provided information.

## 2016-03-10 NOTE — Anesthesia Preprocedure Evaluation (Signed)

## 2016-03-11 ENCOUNTER — Encounter (HOSPITAL_BASED_OUTPATIENT_CLINIC_OR_DEPARTMENT_OTHER): Payer: Self-pay | Admitting: General Surgery

## 2016-09-22 ENCOUNTER — Encounter: Payer: Self-pay | Admitting: Neurology

## 2016-09-23 ENCOUNTER — Encounter: Payer: Self-pay | Admitting: Neurology

## 2016-09-23 ENCOUNTER — Ambulatory Visit (INDEPENDENT_AMBULATORY_CARE_PROVIDER_SITE_OTHER): Payer: BLUE CROSS/BLUE SHIELD | Admitting: Neurology

## 2016-09-23 ENCOUNTER — Encounter (INDEPENDENT_AMBULATORY_CARE_PROVIDER_SITE_OTHER): Payer: Self-pay

## 2016-09-23 VITALS — BP 135/76 | HR 73 | Ht 61.0 in | Wt 145.0 lb

## 2016-09-23 DIAGNOSIS — G478 Other sleep disorders: Secondary | ICD-10-CM

## 2016-09-23 DIAGNOSIS — R51 Headache: Secondary | ICD-10-CM

## 2016-09-23 DIAGNOSIS — G479 Sleep disorder, unspecified: Secondary | ICD-10-CM | POA: Diagnosis not present

## 2016-09-23 DIAGNOSIS — F419 Anxiety disorder, unspecified: Secondary | ICD-10-CM

## 2016-09-23 DIAGNOSIS — Z566 Other physical and mental strain related to work: Secondary | ICD-10-CM

## 2016-09-23 DIAGNOSIS — R351 Nocturia: Secondary | ICD-10-CM

## 2016-09-23 DIAGNOSIS — R519 Headache, unspecified: Secondary | ICD-10-CM

## 2016-09-23 NOTE — Patient Instructions (Addendum)
Based on your symptoms and your exam I believe you are not at hight risk for obstructive sleep apnea or OSA, but I think we should proceed with a sleep study to determine what is going on in your sleep at night. If you have more than mild OSA, I want you to consider treatment with CPAP.   Please remember, the risks and ramifications of moderate to severe obstructive sleep apnea or OSA are: Cardiovascular disease, including congestive heart failure, stroke, difficult to control hypertension, arrhythmias, and even type 2 diabetes has been linked to untreated OSA. Sleep apnea causes disruption of sleep and sleep deprivation in most cases, which, in turn, can cause recurrent headaches, problems with memory, mood, concentration, focus, and vigilance. Most people with untreated sleep apnea report excessive daytime sleepiness, which can affect their ability to drive. Please do not drive if you feel sleepy.   I will likely see you back after your sleep study to go over the test results and where to go from there. We will call you after your sleep study to advise about the results (most likely, you will hear from my nurse) and to set up an appointment at the time, as necessary.    Our sleep lab administrative assistant, Arrie Aran will meet with you or call you to schedule your sleep study. If you don't hear back from her by next week please feel free to call her at [phone]. This is her direct line and please leave a message with your phone number to call back if you get the voicemail box. She will call back as soon as possible.

## 2016-09-23 NOTE — Progress Notes (Signed)
Subjective:    Patient ID: [name] is a 46 y.o. female.  HPI     Star Age, MD, PhD [facility] [address] [place],  [address]  Dear Dr. Virgina Jock,   I saw your patient, [name], upon your kind request in my neurologic clinic today for initial consultation of her sleep disorder, in particular, concern for underlying obstructive sleep apnea. The patient is unaccompanied today. As you know, [name] is a 46 year old right-handed woman with an underlying medical history of allergic rhinitis, vitamin D deficiency, chondromalacia, preeclampsia, recent status lumpectomy right breast, and overweight state, who reports difficulty with sleep maintenance, waking up in the early morning hours with difficulty going back to sleep. I reviewed your office note from 08/13/2016, which you kindly included. Her Epworth sleepiness score is 5 out of 24 today, fatigue score is 48 out of 63. She lives with her husband and daughter, age 35. She works for [employer]. She is a nonsmoker, she does not drink alcohol frequently, maybe twice a month, no day-to-day caffeine, maybe once a week or so. She has been on trazodone 50 mg each night. She does report quite a bit of work related stress and anxiety at times. She has been using trazodone nearly nightly for the past month. She has no history of parasomnias, is a restless sleeper and a light sleeper and that has been going on for some time. She has no family history of restless leg syndrome and does not endorse much in the way of telltale restless leg symptoms. She has Had occasional morning headaches and has nocturia about once per average night. Bedtime is around 10 or 10:30 PM, wakeup time around 6:30 AM. Father has obstructive sleep apnea, mother died suddenly at age 15 with a pulmonary embolism. She does not watch TV in the bedroom. They have 1 dog in the bedroom area, typically not in bed  with them. She did not take her trazodone during her recent 5 day vacation and did not notice much in the way of difference without it. She does believe that part of her sleep difficulties are stress related. She is trying to workout on a regular basis, typically works out with a Clinical research associate. She is stable with her weight. She tries to meditate or do deep breathing exercises or yoga to relax.  Her Past Medical History Is Significant For: Past Medical History:  Diagnosis Date  . Abrasion of right knee 03/04/2016  . 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 TMJ syndrome     Her Past Surgical History Is Significant For: Past Surgical History:  Procedure Laterality Date  . BREAST LUMPECTOMY WITH RADIOACTIVE SEED LOCALIZATION Right 03/10/2016   Procedure: RIGHT BREAST LUMPECTOMY WITH RADIOACTIVE SEED LOCALIZATION;  Surgeon: Autumn Messing III, MD;  Location: [facility];  Service: General;  Laterality: Right;  . CESAREAN SECTION  03/08/2004  . TOE SURGERY Right    age 41    Her Family History Is Significant For: Family History  Problem Relation Age of Onset  . Pulmonary embolism Mother   . Melanoma Sister   . Heart disease Father     Her Social History Is Significant For: Social History   Social History  . Marital status: Single    Spouse name: N/A  . Number of children: N/A  . Years of education: N/A   Social History Main Topics  . Smoking status: Never Smoker  . Smokeless tobacco:  Never Used  . Alcohol use Yes     Comment: occasionally  . Drug use: No  . Sexual activity: Not Asked   Other Topics Concern  . None   Social History Narrative  . None    Her Allergies Are:  No Known Allergies:   Her Current Medications Are:  Outpatient Encounter Prescriptions as of 09/23/2016  Medication Sig  . Cholecalciferol (VITAMIN D-3) 1000 units CAPS Take 1 capsule by mouth 3 (three) times a week.  . Multiple Vitamin (MULTIVITAMIN) tablet Take 1 tablet by mouth daily.  .  traZODone (DESYREL) 50 MG tablet Take 50 mg by mouth at bedtime.  . [DISCONTINUED] HYDROcodone-acetaminophen (NORCO/VICODIN) 5-325 MG tablet Take 1-2 tablets by mouth every 4 (four) hours as needed for moderate pain or severe pain.   No facility-administered encounter medications on file as of 09/23/2016.   :  Review of Systems:  Out of a complete 14 point review of systems, all are reviewed and negative with the exception of these symptoms as listed below: Review of Systems  Neurological:       Pt presents today to discuss her sleep. Pt has no problem going to sleep but will wake up in the early morning hours unable to go back to sleep unless she does yoga or deep breathing exercises. Pt thinks anxiety and stress from her job plays a part in this. Pt has never had a sleep study and does not endorse snoring.  Epworth Sleepiness Scale 0= would never doze 1= slight chance of dozing 2= moderate chance of dozing 3= high chance of dozing  Sitting and reading: 1 Watching TV: 1 Sitting inactive in a public place (ex. Theater or meeting): 0 As a passenger in a car for an hour without a break: 1 Lying down to rest in the afternoon: 2 Sitting and talking to someone: 0 Sitting quietly after lunch (no alcohol): 0 In a car, while stopped in traffic: 0 Total: 5     Objective:  Neurological Exam  Physical Exam Physical Examination:   Vitals:   09/23/16 1106  BP: 135/76  Pulse: 73    General Examination: The patient is a very pleasant 46 y.o. female in no acute distress. She appears well-developed and well-nourished and well groomed.   HEENT: Normocephalic, atraumatic, pupils are equal, round and reactive to light and accommodation. Funduscopic exam is normal with sharp disc margins noted. Extraocular tracking is good without limitation to gaze excursion or nystagmus noted. Normal smooth pursuit is noted. Hearing is grossly intact. Face is symmetric with normal facial animation and normal  facial sensation. Speech is clear with no dysarthria noted. There is no hypophonia. There is no lip, neck/head, jaw or voice tremor. Neck is supple with full range of passive and active motion. There are no carotid bruits on auscultation. Oropharynx exam reveals: no mouth dryness, good dental hygiene and no significant airway crowding, but small airway entry, small uvula, tonsils are 1+, Mallampati is class I.she has a slender neck size of 12-3/4 inches. She has a minimal overbite. Nasal inspection shows no significant nasal mucosal bogginess or redness and mild septum deviation to the right.   Chest: Clear to auscultation without wheezing, rhonchi or crackles noted.  Heart: S1+S2+0, regular and normal without murmurs, rubs or gallops noted.   Abdomen: Soft, non-tender and non-distended with normal bowel sounds appreciated on auscultation.  Extremities: There is no pitting edema in the distal lower extremities bilaterally. Pedal pulses are intact.  Skin: Warm and  dry without trophic changes noted. There are no varicose veins.  Musculoskeletal: exam reveals no obvious joint deformities, tenderness or joint swelling or erythema.   Neurologically:  Mental status: The patient is awake, alert and oriented in all 4 spheres. Her immediate and remote memory, attention, language skills and fund of knowledge are appropriate. There is no evidence of aphasia, agnosia, apraxia or anomia. Speech is clear with normal prosody and enunciation. Thought process is linear. Mood is normal and affect is normal.  Cranial nerves II - XII are as described above under HEENT exam. In addition: shoulder shrug is normal with equal shoulder height noted. Motor exam: Normal bulk, strength and tone is noted. There is no drift, tremor or rebound. Romberg is negative. Reflexes are 2+ throughout. Fine motor skills and coordination: intact with normal finger taps, normal hand movements, normal rapid alternating patting, normal foot  taps and normal foot agility.  Cerebellar testing: No dysmetria or intention tremor on finger to nose testing. Heel to shin is unremarkable bilaterally. There is no truncal or gait ataxia.  Sensory exam: intact to light touch in the upper and lower extremities.  Gait, station and balance: She stands easily. No veering to one side is noted. No leaning to one side is noted. Posture is age-appropriate and stance is narrow based. Gait shows normal stride length and normal pace. No problems turning are noted.  Assessment and Plan:  In summary, ALESSANDRA SAWDEY is a very pleasant 46 y.o.-year old female with an underlying medical history of allergic rhinitis, vitamin D deficiency, chondromalacia, preeclampsia, recent status lumpectomy right breast, and overweight state, who presents for initial consultation of her sleep difficulties, including sleep maintenance difficulties, morning headaches, nocturia. I will request an overnight polysomnogram. While she does not have a telltale history concerning for obstructive sleep apnea, I did talk to her about potential underlying organic sleep disorders.  I explained in particular the risks and ramifications of untreated moderate to severe OSA, especially with respect to developing cardiovascular disease down the Road, including congestive heart failure, difficult to treat hypertension, cardiac arrhythmias, or stroke. Even type 2 diabetes has, in part, been linked to untreated OSA. Symptoms of untreated OSA include daytime sleepiness, memory problems, mood irritability and mood disorder such as depression and anxiety, lack of energy, as well as recurrent headaches, especially morning headaches. We talked about trying to maintain a healthy lifestyle in general, as well as the importance of weight control. I encouraged the patient to eat healthy, exercise daily and keep well hydrated, to keep a scheduled bedtime and wake time routine, to not skip any meals and eat healthy  snacks in between meals. I advised the patient not to drive when feeling sleepy. I recommended the following at this time: sleep study with potential positive airway pressure titration. (We will score hypopneas at 4%).   I explained the sleep test procedure to the patient and also outlined possible surgical and non-surgical treatment options of OSA, including the use of a custom-made dental device (which would require a referral to a specialist dentist or oral surgeon), upper airway surgical options, such as pillar implants, radiofrequency surgery, tongue base surgery, and UPPP (which would involve a referral to an ENT surgeon). Rarely, jaw surgery such as mandibular advancement may be considered.  I also explained the CPAP treatment option to the patient, who indicated that she would be willing to try CPAP if the need arises. I explained the importance of being compliant with PAP treatment, not only  for insurance purposes but primarily to improve Her symptoms, and for the patient's long term health benefit, including to reduce Her cardiovascular risks. I answered all her questions today and the patient was in agreement. I would like to see her back after the sleep study is completed and encouraged her to call with any interim questions, concerns, problems or updates.   Thank you very much for allowing me to participate in the care of this nice patient. If I can be of any further assistance to you please do not hesitate to call me at [phone].  Sincerely,   Star Age, MD, PhD

## 2016-10-13 ENCOUNTER — Telehealth: Payer: Self-pay | Admitting: Neurology

## 2016-10-13 DIAGNOSIS — R519 Headache, unspecified: Secondary | ICD-10-CM

## 2016-10-13 DIAGNOSIS — R51 Headache: Principal | ICD-10-CM

## 2016-10-13 NOTE — Telephone Encounter (Signed)
[employer] denied Split sleep and approved HST.  Can I get an order for HST? °

## 2016-10-13 NOTE — Telephone Encounter (Signed)
HST order placed. 

## 2016-11-04 ENCOUNTER — Telehealth: Payer: Self-pay | Admitting: Neurology

## 2016-11-04 NOTE — Telephone Encounter (Signed)
We have attempted to call the patient 3 times to schedule sleep study. Patient has been unavailable at the phone numbers we have on file and has not returned our calls. At this point we will send a letter asking pt to please contact the sleep lab to schedule their sleep study. If patient calls back we will schedule them for their sleep study. ° °

## 2016-11-29 ENCOUNTER — Ambulatory Visit (INDEPENDENT_AMBULATORY_CARE_PROVIDER_SITE_OTHER): Payer: BLUE CROSS/BLUE SHIELD | Admitting: Neurology

## 2016-11-29 DIAGNOSIS — G4710 Hypersomnia, unspecified: Secondary | ICD-10-CM | POA: Diagnosis not present

## 2016-11-29 DIAGNOSIS — R51 Headache: Secondary | ICD-10-CM

## 2016-11-29 DIAGNOSIS — R519 Headache, unspecified: Secondary | ICD-10-CM

## 2016-11-29 DIAGNOSIS — G479 Sleep disorder, unspecified: Secondary | ICD-10-CM

## 2016-11-29 DIAGNOSIS — G4734 Idiopathic sleep related nonobstructive alveolar hypoventilation: Secondary | ICD-10-CM

## 2016-12-02 ENCOUNTER — Telehealth: Payer: Self-pay

## 2016-12-02 NOTE — Addendum Note (Signed)
Addended by: Star Age on: 12/02/2016 08:34 AM   Modules accepted: Orders

## 2016-12-02 NOTE — Telephone Encounter (Signed)
Pt returned RN's call. Call transferred

## 2016-12-02 NOTE — Telephone Encounter (Signed)
Pt returned my call. I advised her that her HST did not show any significant osa but there were intermittent lower trending oxygen saturations in sleep. Other causes of pt's symptoms cannot be ruled out based on this test. Dr. Rexene Alberts recommends trying to get a PSG approved. Pt is agreeable to this and understands that our sleep lab will attempt to get an in lab PSG approved. Pt verbalized understanding of results. Pt had no questions at this time but was encouraged to call back if questions arise.

## 2016-12-02 NOTE — Telephone Encounter (Signed)
I called pt to discuss her sleep study. No answer, left a message asking her to call me back. 

## 2016-12-02 NOTE — Telephone Encounter (Signed)
-----   Message from Star Age, MD sent at 12/02/2016  8:34 AM EDT ----- Patient referred by Dr. Virgina Jock, seen by me on 09/23/16, HST on 11/29/16.   Please call and notify the patient that the recent home sleep test did not show any significant obstructive sleep apnea, but there were intermittent lower trending oxygen saturations in sleep. Other causes of the patient's symptoms, including circadian rhythm disturbances, an underlying mood disorder, medication effect and/or an underlying medical problem cannot be ruled out based on this test. An attended sleep study will help clarify desaturations and also allow better delineation of her sleep disturbance. I would like to see if we can get a PSG authorized, if patient is in agreement, we will pursue an attended sleep study. I placed an order and will copy Robin.  A copy of the report will be sent to the patient, the PCP and referring MD, if other than PCP.  Once you have spoken to patient, you can close this encounter.   Thanks,  Star Age, MD, PhD [facility])

## 2016-12-02 NOTE — Progress Notes (Signed)
Patient referred by Dr. Virgina Jock, seen by me on 09/23/16, HST on 11/29/16.   Please call and notify the patient that the recent home sleep test did not show any significant obstructive sleep apnea, but there were intermittent lower trending oxygen saturations in sleep. Other causes of the patient's symptoms, including circadian rhythm disturbances, an underlying mood disorder, medication effect and/or an underlying medical problem cannot be ruled out based on this test. An attended sleep study will help clarify desaturations and also allow better delineation of her sleep disturbance. I would like to see if we can get a PSG authorized, if patient is in agreement, we will pursue an attended sleep study. I placed an order and will copy Robin.  A copy of the report will be sent to the patient, the PCP and referring MD, if other than PCP.  Once you have spoken to patient, you can close this encounter.   Thanks,  Star Age, MD, PhD [facility])

## 2016-12-02 NOTE — Procedures (Signed)
  [facility] [place], [place] [address] NAME: [name] DOB: [id number] MEDICAL RECORD [id number] DOS: 11/29/16 REFERRING PHYSICIAN: Shon Baton STUDY PERFORMED: Home Sleep Study HISTORY: 46 year old woman with a history of allergic rhinitis, vitamin D deficiency, chondromalacia, preeclampsia, recent status lumpectomy right breast, and overweight state, who reports difficulty with sleep maintenance, waking up in the early morning hours with difficulty going back to sleep.   STUDY RESULTS: Total Recording:    8 Hour, 8 Minutes Total Apnea/Hypopnea Index (AHI):    2.3/Hour Average Oxygen Saturation:     95% Lowest Oxygen Saturation:       81%  Time Oxygen Saturation Below 88%:    6%, 29 min Average Heart Rate:     63 BPM IMPRESSION: Sleep disturbance, oxygen desaturation in sleep RECOMMENDATION: This home sleep test does not demonstrate any significant obstructive or central sleep disordered breathing, but there were intermittent lower trending oxygen saturations in sleep. Other causes of the patient's symptoms, including circadian rhythm disturbances, an underlying mood disorder, medication effect and/or an underlying medical problem cannot be ruled out based on this test. An attended sleep study will help clarify desaturations and also allow better delineation of her sleep disturbance. Clinical correlation is recommended.  The patient should be cautioned not to drive, work at heights, or operate dangerous or heavy equipment when tired or sleepy. Review and reiteration of good sleep hygiene measures should be pursued with any patient. The patient and her referring provider will be notified of the test results; a follow up appointment will be made as necessary.  I certify that I have reviewed the raw data recording prior to the issuance of this report in accordance with the standards of the American Academy of Sleep Medicine (AASM). Star Age, MD, PhD Diplomat, ABPN (Neurology and Sleep)

## 2017-01-31 DIAGNOSIS — Z6826 Body mass index (BMI) 26.0-26.9, adult: Secondary | ICD-10-CM | POA: Diagnosis not present

## 2017-01-31 DIAGNOSIS — Z01419 Encounter for gynecological examination (general) (routine) without abnormal findings: Secondary | ICD-10-CM | POA: Diagnosis not present

## 2017-01-31 DIAGNOSIS — Z1151 Encounter for screening for human papillomavirus (HPV): Secondary | ICD-10-CM | POA: Diagnosis not present

## 2017-01-31 DIAGNOSIS — J069 Acute upper respiratory infection, unspecified: Secondary | ICD-10-CM | POA: Diagnosis not present

## 2017-01-31 DIAGNOSIS — Z01411 Encounter for gynecological examination (general) (routine) with abnormal findings: Secondary | ICD-10-CM | POA: Diagnosis not present

## 2017-01-31 DIAGNOSIS — Z1231 Encounter for screening mammogram for malignant neoplasm of breast: Secondary | ICD-10-CM | POA: Diagnosis not present

## 2017-05-04 DIAGNOSIS — E559 Vitamin D deficiency, unspecified: Secondary | ICD-10-CM | POA: Diagnosis not present

## 2017-08-23 DIAGNOSIS — R5383 Other fatigue: Secondary | ICD-10-CM | POA: Diagnosis not present

## 2017-08-23 DIAGNOSIS — E559 Vitamin D deficiency, unspecified: Secondary | ICD-10-CM | POA: Diagnosis not present

## 2017-08-23 DIAGNOSIS — G478 Other sleep disorders: Secondary | ICD-10-CM | POA: Diagnosis not present

## 2017-08-23 DIAGNOSIS — D72819 Decreased white blood cell count, unspecified: Secondary | ICD-10-CM | POA: Diagnosis not present

## 2017-08-31 DIAGNOSIS — D2239 Melanocytic nevi of other parts of face: Secondary | ICD-10-CM | POA: Diagnosis not present

## 2017-08-31 DIAGNOSIS — L821 Other seborrheic keratosis: Secondary | ICD-10-CM | POA: Diagnosis not present

## 2017-08-31 DIAGNOSIS — D1801 Hemangioma of skin and subcutaneous tissue: Secondary | ICD-10-CM | POA: Diagnosis not present

## 2017-08-31 DIAGNOSIS — D225 Melanocytic nevi of trunk: Secondary | ICD-10-CM | POA: Diagnosis not present

## 2017-09-27 DIAGNOSIS — E559 Vitamin D deficiency, unspecified: Secondary | ICD-10-CM | POA: Diagnosis not present

## 2017-09-27 DIAGNOSIS — N958 Other specified menopausal and perimenopausal disorders: Secondary | ICD-10-CM | POA: Diagnosis not present

## 2017-09-27 DIAGNOSIS — Z6826 Body mass index (BMI) 26.0-26.9, adult: Secondary | ICD-10-CM | POA: Diagnosis not present

## 2017-09-27 DIAGNOSIS — G479 Sleep disorder, unspecified: Secondary | ICD-10-CM | POA: Diagnosis not present

## 2017-11-09 DIAGNOSIS — N951 Menopausal and female climacteric states: Secondary | ICD-10-CM | POA: Diagnosis not present

## 2017-11-11 DIAGNOSIS — N898 Other specified noninflammatory disorders of vagina: Secondary | ICD-10-CM | POA: Diagnosis not present

## 2017-11-11 DIAGNOSIS — N951 Menopausal and female climacteric states: Secondary | ICD-10-CM | POA: Diagnosis not present

## 2017-11-11 DIAGNOSIS — R6882 Decreased libido: Secondary | ICD-10-CM | POA: Diagnosis not present

## 2017-11-11 DIAGNOSIS — R232 Flushing: Secondary | ICD-10-CM | POA: Diagnosis not present

## 2017-11-15 DIAGNOSIS — D485 Neoplasm of uncertain behavior of skin: Secondary | ICD-10-CM | POA: Diagnosis not present

## 2017-11-15 DIAGNOSIS — L986 Other infiltrative disorders of the skin and subcutaneous tissue: Secondary | ICD-10-CM | POA: Diagnosis not present

## 2017-11-22 DIAGNOSIS — Z780 Asymptomatic menopausal state: Secondary | ICD-10-CM | POA: Diagnosis not present

## 2018-01-25 DIAGNOSIS — Z0000 Encounter for general adult medical examination without abnormal findings: Secondary | ICD-10-CM | POA: Diagnosis not present

## 2018-01-25 DIAGNOSIS — R82998 Other abnormal findings in urine: Secondary | ICD-10-CM | POA: Diagnosis not present

## 2018-01-25 DIAGNOSIS — E559 Vitamin D deficiency, unspecified: Secondary | ICD-10-CM | POA: Diagnosis not present

## 2018-01-25 DIAGNOSIS — R5383 Other fatigue: Secondary | ICD-10-CM | POA: Diagnosis not present

## 2018-01-31 DIAGNOSIS — Z0000 Encounter for general adult medical examination without abnormal findings: Secondary | ICD-10-CM | POA: Diagnosis not present

## 2018-01-31 DIAGNOSIS — R5383 Other fatigue: Secondary | ICD-10-CM | POA: Diagnosis not present

## 2018-01-31 DIAGNOSIS — Z1389 Encounter for screening for other disorder: Secondary | ICD-10-CM | POA: Diagnosis not present

## 2018-01-31 DIAGNOSIS — E559 Vitamin D deficiency, unspecified: Secondary | ICD-10-CM | POA: Diagnosis not present

## 2018-01-31 DIAGNOSIS — D72819 Decreased white blood cell count, unspecified: Secondary | ICD-10-CM | POA: Diagnosis not present

## 2018-01-31 DIAGNOSIS — G479 Sleep disorder, unspecified: Secondary | ICD-10-CM | POA: Diagnosis not present

## 2018-02-06 DIAGNOSIS — Z01419 Encounter for gynecological examination (general) (routine) without abnormal findings: Secondary | ICD-10-CM | POA: Diagnosis not present

## 2018-02-06 DIAGNOSIS — Z6826 Body mass index (BMI) 26.0-26.9, adult: Secondary | ICD-10-CM | POA: Diagnosis not present

## 2018-02-06 DIAGNOSIS — Z1231 Encounter for screening mammogram for malignant neoplasm of breast: Secondary | ICD-10-CM | POA: Diagnosis not present

## 2018-02-06 DIAGNOSIS — Z1151 Encounter for screening for human papillomavirus (HPV): Secondary | ICD-10-CM | POA: Diagnosis not present

## 2018-04-17 DIAGNOSIS — F411 Generalized anxiety disorder: Secondary | ICD-10-CM | POA: Diagnosis not present

## 2018-04-17 DIAGNOSIS — G4709 Other insomnia: Secondary | ICD-10-CM | POA: Diagnosis not present

## 2018-04-17 DIAGNOSIS — N951 Menopausal and female climacteric states: Secondary | ICD-10-CM | POA: Diagnosis not present

## 2018-05-29 DIAGNOSIS — F411 Generalized anxiety disorder: Secondary | ICD-10-CM | POA: Diagnosis not present

## 2018-05-29 DIAGNOSIS — G4709 Other insomnia: Secondary | ICD-10-CM | POA: Diagnosis not present

## 2018-05-29 DIAGNOSIS — N951 Menopausal and female climacteric states: Secondary | ICD-10-CM | POA: Diagnosis not present

## 2018-11-23 DIAGNOSIS — G4709 Other insomnia: Secondary | ICD-10-CM | POA: Diagnosis not present

## 2018-11-23 DIAGNOSIS — R5383 Other fatigue: Secondary | ICD-10-CM | POA: Diagnosis not present

## 2018-11-23 DIAGNOSIS — N951 Menopausal and female climacteric states: Secondary | ICD-10-CM | POA: Diagnosis not present

## 2018-11-23 DIAGNOSIS — F411 Generalized anxiety disorder: Secondary | ICD-10-CM | POA: Diagnosis not present

## 2019-01-19 DIAGNOSIS — M792 Neuralgia and neuritis, unspecified: Secondary | ICD-10-CM | POA: Diagnosis not present

## 2019-01-30 DIAGNOSIS — R5383 Other fatigue: Secondary | ICD-10-CM | POA: Diagnosis not present

## 2019-01-30 DIAGNOSIS — E559 Vitamin D deficiency, unspecified: Secondary | ICD-10-CM | POA: Diagnosis not present

## 2019-01-30 DIAGNOSIS — Z0000 Encounter for general adult medical examination without abnormal findings: Secondary | ICD-10-CM | POA: Diagnosis not present

## 2019-01-30 DIAGNOSIS — R7989 Other specified abnormal findings of blood chemistry: Secondary | ICD-10-CM | POA: Diagnosis not present

## 2019-02-08 DIAGNOSIS — E559 Vitamin D deficiency, unspecified: Secondary | ICD-10-CM | POA: Diagnosis not present

## 2019-02-08 DIAGNOSIS — Z0000 Encounter for general adult medical examination without abnormal findings: Secondary | ICD-10-CM | POA: Diagnosis not present

## 2019-02-08 DIAGNOSIS — G479 Sleep disorder, unspecified: Secondary | ICD-10-CM | POA: Diagnosis not present

## 2019-02-08 DIAGNOSIS — Z1331 Encounter for screening for depression: Secondary | ICD-10-CM | POA: Diagnosis not present

## 2019-02-08 DIAGNOSIS — M792 Neuralgia and neuritis, unspecified: Secondary | ICD-10-CM | POA: Diagnosis not present

## 2019-02-08 DIAGNOSIS — R197 Diarrhea, unspecified: Secondary | ICD-10-CM | POA: Diagnosis not present

## 2019-02-09 DIAGNOSIS — Z6827 Body mass index (BMI) 27.0-27.9, adult: Secondary | ICD-10-CM | POA: Diagnosis not present

## 2019-02-09 DIAGNOSIS — Z1231 Encounter for screening mammogram for malignant neoplasm of breast: Secondary | ICD-10-CM | POA: Diagnosis not present

## 2019-02-09 DIAGNOSIS — Z1151 Encounter for screening for human papillomavirus (HPV): Secondary | ICD-10-CM | POA: Diagnosis not present

## 2019-02-09 DIAGNOSIS — Z01419 Encounter for gynecological examination (general) (routine) without abnormal findings: Secondary | ICD-10-CM | POA: Diagnosis not present

## 2019-04-26 ENCOUNTER — Ambulatory Visit: Payer: BLUE CROSS/BLUE SHIELD

## 2019-04-30 ENCOUNTER — Ambulatory Visit: Payer: Self-pay | Attending: Family

## 2019-04-30 DIAGNOSIS — Z23 Encounter for immunization: Secondary | ICD-10-CM | POA: Insufficient documentation

## 2019-04-30 NOTE — Progress Notes (Signed)
   Covid-19 Vaccination Clinic  Name:  [name]    MRN: [id number] DOB: 11-10-1970  04/30/2019  [name] was observed post Covid-19 immunization for 15 minutes without incidence. She was provided with Vaccine Information Sheet and instruction to access the V-Safe system.   [name] was instructed to call 911 with any severe reactions post vaccine: Marland Kitchen Difficulty breathing  . Swelling of your face and throat  . A fast heartbeat  . A bad rash all over your body  . Dizziness and weakness    Immunizations Administered    Name Date Dose VIS Date Route   Moderna COVID-19 Vaccine 04/30/2019 11:40 AM 0.5 mL 02/06/2019 Intramuscular   Manufacturer: Moderna   Lot: [id number]   [id number]

## 2019-05-29 ENCOUNTER — Ambulatory Visit: Payer: Self-pay | Attending: Family

## 2019-05-29 DIAGNOSIS — Z23 Encounter for immunization: Secondary | ICD-10-CM

## 2019-05-29 NOTE — Progress Notes (Signed)
   Covid-19 Vaccination Clinic  Name:  [name]    MRN: [id number] DOB: [birth year]  05/29/2019  [name] was observed post Covid-19 immunization for 15 minutes without incident. She was provided with Vaccine Information Sheet and instruction to access the V-Safe system.   [name] was instructed to call 911 with any severe reactions post vaccine: Marland Kitchen Difficulty breathing  . Swelling of face and throat  . A fast heartbeat  . A bad rash all over body  . Dizziness and weakness   Immunizations Administered    Name Date Dose VIS Date Route   Moderna COVID-19 Vaccine 05/29/2019  2:44 PM 0.5 mL 02/06/2019 Intramuscular   Manufacturer: Moderna   LotHQ:[id number]   [place] HillsVO:[id number]

## 2019-05-30 ENCOUNTER — Encounter: Payer: Self-pay | Admitting: Gastroenterology

## 2019-06-27 ENCOUNTER — Ambulatory Visit (AMBULATORY_SURGERY_CENTER): Payer: Self-pay | Admitting: *Deleted

## 2019-06-27 ENCOUNTER — Other Ambulatory Visit: Payer: Self-pay

## 2019-06-27 VITALS — Temp 96.8°F | Ht 61.75 in | Wt 151.0 lb

## 2019-06-27 DIAGNOSIS — Z1211 Encounter for screening for malignant neoplasm of colon: Secondary | ICD-10-CM

## 2019-06-27 NOTE — Progress Notes (Signed)
05-29-2019 completed covid vaccines   No egg or soy allergy known to patient  No issues with past sedation with any surgeries  or procedures, no intubation problems  No diet pills per patient No home 02 use per patient  No blood thinners per patient  Pt denies issues with constipation  No A fib or A flutter  EMMI video sent to pt's e mail   Last week PCP saw pt for some chest pressure- all labs, ekg normal- pt has a stress test for 07-06-19 and is aware if normal, ok to proceed, if further testing, will have to cancel colon until completed cardiac clearance - in book in 50 to follow     Due to the COVID-19 pandemic we are asking patients to follow these guidelines. Please only bring one care partner. Please be aware that your care partner may wait in the car in the parking lot or if they feel like they will be too hot to wait in the car, they may wait in the lobby on the 4th floor. All care partners are required to wear a mask the entire time (we do not have any that we can provide them), they need to practice social distancing, and we will do a Covid check for all patient's and care partners when you arrive. Also we will check their temperature and your temperature. If the care partner waits in their car they need to stay in the parking lot the entire time and we will call them on their cell phone when the patient is ready for discharge so they can bring the car to the front of the building. Also all patient's will need to wear a mask into building.

## 2019-07-06 ENCOUNTER — Ambulatory Visit (INDEPENDENT_AMBULATORY_CARE_PROVIDER_SITE_OTHER): Payer: 59 | Admitting: Internal Medicine

## 2019-07-06 ENCOUNTER — Encounter: Payer: Self-pay | Admitting: Internal Medicine

## 2019-07-06 ENCOUNTER — Other Ambulatory Visit: Payer: Self-pay

## 2019-07-06 VITALS — BP 124/80 | HR 55 | Ht 62.0 in | Wt 152.0 lb

## 2019-07-06 DIAGNOSIS — Z9189 Other specified personal risk factors, not elsewhere classified: Secondary | ICD-10-CM | POA: Diagnosis not present

## 2019-07-06 DIAGNOSIS — R079 Chest pain, unspecified: Secondary | ICD-10-CM | POA: Diagnosis not present

## 2019-07-06 NOTE — Patient Instructions (Addendum)
Medication Instructions:  No change  *If you need a refill on your cardiac medications before your next appointment, please call your pharmacy*   Lab Work: Not needed If you have labs (blood work) drawn today and your tests are completely normal, you will receive your results only by: Marland Kitchen MyChart Message (if you have MyChart) OR . A paper copy in the mail If you have any lab test that is abnormal or we need to change your treatment, we will call you to review the results.   Testing/Procedures: Will be schedule at [facility] TEST  3 DAYS PRIOR TO TESTING.-- [place] .- [place]  Your physician has requested that you have an exercise tolerance test. For further information please visit [email or web address]. Please also follow instruction sheet, as given.   WILL BE SCHEDULE AT [place] [facility] Your physician has requested that you have an echocardiogram. Echocardiography is a painless test that uses sound waves to create images of your heart. It provides your doctor with information about the size and shape of your heart and how well your heart's chambers and valves are working. This procedure takes approximately one hour. There are no restrictions for this procedure.   AND  CT coronary calcium score. This test is done at [address]. This is $150 out of pocket.   Coronary CalciumScan A coronary calcium scan is an imaging test used to look for deposits of calcium and other fatty materials (plaques) in the inner lining of the blood vessels of the heart (coronary arteries). These deposits of calcium and plaques can partly clog and narrow the coronary arteries without producing any symptoms or warning signs. This puts a person at risk for a heart attack. This test can detect these deposits before symptoms develop. Tell a health care provider about:  Any allergies you have.  All medicines you  are taking, including vitamins, herbs, eye drops, creams, and over-the-counter medicines.  Any problems you or family members have had with anesthetic medicines.  Any blood disorders you have.  Any surgeries you have had.  Any medical conditions you have.  Whether you are pregnant or may be pregnant. What are the risks? Generally, this is a safe procedure. However, problems may occur, including:  Harm to a pregnant woman and her unborn baby. This test involves the use of radiation. Radiation exposure can be dangerous to a pregnant woman and her unborn baby. If you are pregnant, you generally should not have this procedure done.  Slight increase in the risk of cancer. This is because of the radiation involved in the test. What happens before the procedure? No preparation is needed for this procedure. What happens during the procedure?  You will undress and remove any jewelry around your neck or chest.  You will put on a hospital gown.  Sticky electrodes will be placed on your chest. The electrodes will be connected to an electrocardiogram (ECG) machine to record a tracing of the electrical activity of your heart.  A CT scanner will take pictures of your heart. During this time, you will be asked to lie still and hold your breath for 2-3 seconds while a picture of your heart is being taken. The procedure may vary among health care providers and hospitals. What happens after the procedure?  You can get dressed.  You can return to your normal activities.  It is up to you  to get the results of your test. Ask your health care provider, or the department that is doing the test, when your results will be ready. Summary  A coronary calcium scan is an imaging test used to look for deposits of calcium and other fatty materials (plaques) in the inner lining of the blood vessels of the heart (coronary arteries).  Generally, this is a safe procedure. Tell your health care provider if you  are pregnant or may be pregnant.  No preparation is needed for this procedure.  A CT scanner will take pictures of your heart.  You can return to your normal activities after the scan is done. This information is not intended to replace advice given to you by your health care provider. Make sure you discuss any questions you have with your health care provider. Document Released: 08/21/2007 Document Revised: 01/12/2016 Document Reviewed: 01/12/2016 Elsevier Interactive Patient Education  2017 [place]: At [facility], you and your health needs are our priority.  As part of our continuing mission to provide you with exceptional heart care, we have created designated Provider Care Teams.  These Care Teams include your primary Cardiologist (physician) and Advanced Practice Providers (APPs -  Physician Assistants and Nurse Practitioners) who all work together to provide you with the care you need, when you need it.  We recommend signing up for the patient portal called "MyChart".  Sign up information is provided on this After Visit Summary.  MyChart is used to connect with patients for Virtual Visits (Telemedicine).  Patients are able to view lab/test results, encounter notes, upcoming appointments, etc.  Non-urgent messages can be sent to your provider as well.   To learn more about what you can do with MyChart, go to [email or web address].    Your next appointment:   1 month(s)  The format for your next appointment:   Virtual Visit   Provider:   Cherlynn Kaiser, MD   Other Instructions n/a

## 2019-07-06 NOTE — Progress Notes (Signed)
Cardiology Office Note:    Date:  [id number]   ID:  [name], DOB [id number], MRN [id number]  PCP:  Shon Baton, MD  Cardiologist:  No primary care provider on file.  Electrophysiologist:  None   Referring MD: Shon Baton, MD   Chief Complaint: chest pain  History of Present Illness:    [name] is a 49 y.o. female with a history of preeclampsia who presents today for evaluation of chest pain that occurred during a period of high work-related stress.  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  She has a high stress job which involves significant interpersonal dealings.  Approximately 2 to 3 weeks ago she had an episode of chest pressure during a period of high stress.  She does have a history of anxiety and previously took anxiety medication.  When she felt better in the past she stopped this medication, however has been more recently gone back on the medication and does feel better.  She denies any current chest pain.  She has no shortness of breath with exertion.  Denies significant palpitations.  She did have a sensation of palpitations approximately 7 years ago after the death of her mother.  Her mother died suddenly of what was presumed to be a pulmonary embolism.  An EKG was taken by her physician at work and was unremarkable per her report.  Her father has history of coronary artery disease.  Her older sister has no known health issues.  She is a never smoker, no frequent alcohol consumption, no herbal supplements.  She had preeclampsia during the timeframe of the birth of her now 30 year old daughter.  She has had no other blood pressure issues after this time.  We discussed the options for screening for cardiovascular disease including stress testing, coronary calcium scans, CCTA.  Past Medical History:  Diagnosis Date  . Abrasion of right knee 03/04/2016  . Allergy    runny nose  . Anxiety    . Hypertension    with pregnancy- preeclampsia ONLY   . 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 TMJ syndrome     Past Surgical History:  Procedure Laterality Date  . BREAST LUMPECTOMY WITH RADIOACTIVE SEED LOCALIZATION Right 03/10/2016   Procedure: RIGHT BREAST LUMPECTOMY WITH RADIOACTIVE SEED LOCALIZATION;  Surgeon: Autumn Messing III, MD;  Location: [facility];  Service: General;  Laterality: Right;  . CESAREAN SECTION  03/08/2004  . TOE SURGERY Right    age 41    Current Medications: Current Meds  Medication Sig  . Apoaequorin (PREVAGEN PO) Take by mouth. 3-4 x a week  . Ascorbic Acid (VITAMIN C) 500 MG CAPS Take by mouth daily.  . Cholecalciferol (VITAMIN D-3) 1000 units CAPS Take 1 capsule by mouth 3 (three) times a week.  . citalopram (CELEXA) 10 MG tablet Take 10 mg by mouth daily.  . citalopram (CELEXA) 20 MG tablet Take 20 mg by mouth daily.  . eszopiclone (LUNESTA) 1 MG TABS tablet Take 1 mg by mouth at bedtime as needed for sleep. Take immediately before bedtime  . Misc Natural Products (APPLE CIDER VINEGAR DIET PO) Take by mouth. 1-2 gummies daily  . Multiple Vitamin (MULTIVITAMIN) tablet Take 1 tablet by mouth daily.  . traZODone (DESYREL) 50 MG tablet Take 50 mg by mouth at bedtime.     Allergies:   Patient has no known allergies.   Social History  Socioeconomic History  . Marital status: Single    Spouse name: Not on file  . Number of children: Not on file  . Years of education: Not on file  . Highest education level: Not on file  Occupational History  . Not on file  Tobacco Use  . Smoking status: Never Smoker  . Smokeless tobacco: Never Used  Substance and Sexual Activity  . Alcohol use: Yes    Comment: occasionally  . Drug use: No  . Sexual activity: Not on file  Other Topics Concern  . Not on file  Social History Narrative  . Not on file   Social Determinants of Health   Financial Resource Strain:   . Difficulty of Paying Living  Expenses:   Food Insecurity:   . Worried About Charity fundraiser in the Last Year:   . Arboriculturist in the Last Year:   Transportation Needs:   . Film/video editor (Medical):   Marland Kitchen Lack of Transportation (Non-Medical):   Physical Activity:   . Days of Exercise per Week:   . Minutes of Exercise per Session:   Stress:   . Feeling of Stress :   Social Connections:   . Frequency of Communication with Friends and Family:   . Frequency of Social Gatherings with Friends and Family:   . Attends Religious Services:   . Active Member of Clubs or Organizations:   . Attends Archivist Meetings:   Marland Kitchen Marital Status:      Family History: The patient's family history includes Colon polyps in her father; Heart disease in her father; Melanoma in her sister; Pulmonary embolism in her mother. There is no history of Colon cancer, Esophageal cancer, Rectal cancer, or Stomach cancer.  ROS:   Please see the history of present illness.    All other systems reviewed and are negative.  EKGs/Labs/Other Studies Reviewed:    The following studies were reviewed today:  EKG:  Sinus bradycardia rate 55  Recent Labs: No results found for requested labs within last 8760 hours.  Recent Lipid Panel No results found for: CHOL, TRIG, HDL, CHOLHDL, VLDL, LDLCALC, LDLDIRECT  Physical Exam:    VS:  BP 124/80   Pulse (!) 55   Ht 5\' 2"  (1.575 m)   Wt 152 lb (68.9 kg)   SpO2 97%   BMI 27.80 kg/m     Wt Readings from Last 5 Encounters:  07/06/19 152 lb (68.9 kg)  06/27/19 151 lb (68.5 kg)  09/23/16 145 lb (65.8 kg)  03/10/16 141 lb (64 kg)     Constitutional: No acute distress Eyes: sclera non-icteric, normal conjunctiva and lids ENMT: normal dentition, moist mucous membranes Cardiovascular: regular rhythm, normal rate, no murmurs. S1 and S2 normal. Radial pulses normal bilaterally. No jugular venous distention.  Respiratory: clear to auscultation bilaterally GI : normal bowel  sounds, soft and nontender. No distention.   MSK: extremities warm, well perfused. No edema.  NEURO: grossly nonfocal exam, moves all extremities. PSYCH: alert and oriented x 3, normal mood and affect.   ASSESSMENT:    1. Chest pain, unspecified type   2. Cardiovascular risk factor    PLAN:    Chest pain, unspecified type - Plan: EXERCISE TOLERANCE TEST (ETT)  Cardiovascular risk factor - Plan: EXERCISE TOLERANCE TEST (ETT)   For chest pain workup we will complete an echocardiogram and an ETT. She is interested in coronary calcium score. We discussed in the setting of chest pain a  calcium score is not a complete evaluation but would be reasonable for screening for risk assessment.   Cherlynn Kaiser, MD [place]  [facility]    Medication Adjustments/Labs and Tests Ordered: Current medicines are reviewed at length with the patient today.  Concerns regarding medicines are outlined above.  No orders of the defined types were placed in this encounter.  No orders of the defined types were placed in this encounter.   There are no Patient Instructions on file for this visit.

## 2019-07-09 ENCOUNTER — Telehealth: Payer: Self-pay | Admitting: *Deleted

## 2019-07-09 NOTE — Telephone Encounter (Signed)
Patient is being seen by Cardiologist for chest pain and has stress test and echo ordered. Called patient and left a message for her to call us back to reschedule the colonoscopy until after all test are completed and cleared by her cardiologist to have this procedure.

## 2019-07-09 NOTE — Telephone Encounter (Signed)
Called patient to schedule pre-visit for procedure in July left voicemail

## 2019-07-11 ENCOUNTER — Encounter: Payer: Self-pay | Admitting: Gastroenterology

## 2019-07-11 NOTE — Telephone Encounter (Signed)
I did schedule the PV with the patient.

## 2019-07-17 ENCOUNTER — Encounter: Payer: Self-pay | Admitting: Gastroenterology

## 2019-07-19 ENCOUNTER — Telehealth (HOSPITAL_COMMUNITY): Payer: Self-pay

## 2019-07-19 NOTE — Telephone Encounter (Signed)
Encounter complete. 

## 2019-07-21 ENCOUNTER — Other Ambulatory Visit (HOSPITAL_COMMUNITY)
Admission: RE | Admit: 2019-07-21 | Discharge: 2019-07-21 | Disposition: A | Discharge status: Home / Self Care | Payer: 59 | Source: Ambulatory Visit | Attending: Internal Medicine | Admitting: Internal Medicine

## 2019-07-21 DIAGNOSIS — Z01812 Encounter for preprocedural laboratory examination: Secondary | ICD-10-CM | POA: Insufficient documentation

## 2019-07-21 DIAGNOSIS — Z20822 Contact with and (suspected) exposure to covid-19: Secondary | ICD-10-CM | POA: Diagnosis not present

## 2019-07-21 LAB — SARS CORONAVIRUS 2 (TAT 6-24 HRS): SARS Coronavirus 2: NEGATIVE

## 2019-07-25 ENCOUNTER — Other Ambulatory Visit: Payer: Self-pay

## 2019-07-25 ENCOUNTER — Ambulatory Visit (HOSPITAL_COMMUNITY)
Admission: RE | Admit: 2019-07-25 | Discharge: 2019-07-25 | Disposition: A | Discharge status: Home / Self Care | Payer: 59 | Source: Ambulatory Visit | Attending: Cardiology | Admitting: Cardiology

## 2019-07-25 ENCOUNTER — Encounter (HOSPITAL_COMMUNITY): Payer: Self-pay | Admitting: *Deleted

## 2019-07-25 ENCOUNTER — Telehealth: Payer: Self-pay | Admitting: *Deleted

## 2019-07-25 DIAGNOSIS — R079 Chest pain, unspecified: Secondary | ICD-10-CM | POA: Diagnosis present

## 2019-07-25 DIAGNOSIS — Z9189 Other specified personal risk factors, not elsewhere classified: Secondary | ICD-10-CM | POA: Diagnosis not present

## 2019-07-25 DIAGNOSIS — R9439 Abnormal result of other cardiovascular function study: Secondary | ICD-10-CM

## 2019-07-25 LAB — EXERCISE TOLERANCE TEST
Estimated workload: 12.3 METS
Exercise duration (min): 10 min
Exercise duration (sec): 21 s
MPHR: 171 {beats}/min
Peak HR: 181 {beats}/min
Percent HR: 105 %
Rest HR: 74 {beats}/min

## 2019-07-25 NOTE — Telephone Encounter (Signed)
Patient in office to have GXT completed.  technician  Discussed finding with Dr Margaretann Loveless.  Dr Matilde Sprang to patient.   Per Dr Margaretann Loveless,   Cancelled calcium scoring  07/26/19 Continue with echo on 07/26/19  order CCTA . Patient is aware order.   Schedule a Video /virtual  Visit --07/27/19 at 8 am  Patient will receive instruction for CCTA at virtual visit- and instruction will be mail to patient.    all orders completed.

## 2019-07-25 NOTE — Progress Notes (Unsigned)
Abnormal ETT was reviewed by Dr. Margaretann Loveless. Patient was given the ok to be discharged to go home.

## 2019-07-26 ENCOUNTER — Ambulatory Visit (HOSPITAL_COMMUNITY): Payer: 59 | Attending: Cardiovascular Disease

## 2019-07-26 ENCOUNTER — Inpatient Hospital Stay: Admission: RE | Admit: 2019-07-26 | Payer: 59 | Source: Ambulatory Visit

## 2019-07-26 DIAGNOSIS — R079 Chest pain, unspecified: Secondary | ICD-10-CM

## 2019-07-26 DIAGNOSIS — Z9189 Other specified personal risk factors, not elsewhere classified: Secondary | ICD-10-CM | POA: Diagnosis not present

## 2019-07-26 NOTE — Progress Notes (Signed)
Discussed abnormal stress ECG with patient in the stress lab after the study was completed. CCTA scheduled.

## 2019-07-27 ENCOUNTER — Encounter: Payer: Self-pay | Admitting: Internal Medicine

## 2019-07-27 ENCOUNTER — Telehealth (INDEPENDENT_AMBULATORY_CARE_PROVIDER_SITE_OTHER): Payer: 59 | Admitting: Internal Medicine

## 2019-07-27 VITALS — BP 120/87 | Ht 61.0 in | Wt 150.0 lb

## 2019-07-27 DIAGNOSIS — R9439 Abnormal result of other cardiovascular function study: Secondary | ICD-10-CM | POA: Diagnosis not present

## 2019-07-27 DIAGNOSIS — R079 Chest pain, unspecified: Secondary | ICD-10-CM | POA: Diagnosis not present

## 2019-07-27 NOTE — Patient Instructions (Addendum)
Medication Instructions:  Your physician recommends that you continue on your current medications as directed. Please refer to the Current Medication list given to you today.  *If you need a refill on your cardiac medications before your next appointment, please call your pharmacy*  Lab Work: BMET 1 WEEK PRIOR TO CT  LAB IS OPEN Monday-Friday 8:00-4:00 WITH LUNCH 12:45-1:45, NO APPOINTMENT NECESSARY   Testing/Procedures:. Your physician has requested that you have cardiac CT. Cardiac computed tomography (CT) is a painless test that uses an x-ray machine to take clear, detailed pictures of your heart. For further information please visit [email or web address]. Please follow instruction sheet as given. THE OFFICE WILL CALL YOU TO SCHEDULE ONCE YOUR INSURANCE HAS APPROVED   Follow-Up: At [facility], you and your health needs are our priority.  As part of our continuing mission to provide you with exceptional heart care, we have created designated Provider Care Teams.  These Care Teams include your primary Cardiologist (physician) and Advanced Practice Providers (APPs -  Physician Assistants and Nurse Practitioners) who all work together to provide you with the care you need, when you need it.  We recommend signing up for the patient portal called "MyChart".  Sign up information is provided on this After Visit Summary.  MyChart is used to connect with patients for Virtual Visits (Telemedicine).  Patients are able to view lab/test results, encounter notes, upcoming appointments, etc.  Non-urgent messages can be sent to your provider as well.   To learn more about what you can do with MyChart, go to [email or web address].    Your next appointment:   4 week(s) AFTER CARDIAC CT   The format for your next appointment:   In Person  Provider:   You may see DR Margaretann Loveless or one of the following Advanced Practice Providers on your designated Care Team:    Rosaria Ferries, PA-C  Jory Sims,  DNP, ANP  Cadence Kathlen Mody, NP  Other Instructions  Your cardiac CT will be scheduled at one of the below locations:   [facility] [address] [place], [place] [address] [phone]  [facility] [address] [place], [place] [address] [phone]  If scheduled at [facility], please arrive at the [facility] main entrance of [facility] 30 minutes prior to test start time. Proceed to the [facility] Radiology Department (first floor) to check-in and test prep.  If scheduled at [facility], please arrive 15 mins early for check-in and test prep.  Please follow these instructions carefully (unless otherwise directed):  Hold all erectile dysfunction medications at least 3 days (72 hrs) prior to test.  On the Night Before the Test: . Be sure to Drink plenty of water. . Do not consume any caffeinated/decaffeinated beverages or chocolate 12 hours prior to your test. . Do not take any antihistamines 12 hours prior to your test. . If you take Metformin do not take 24 hours prior to test. . If the patient has contrast allergy: ? Patient will need a prescription for Prednisone and very clear instructions (as follows): 1. Prednisone 50 mg - take 13 hours prior to test 2. Take another Prednisone 50 mg 7 hours prior to test 3. Take another Prednisone 50 mg 1 hour prior to test 4. Take Benadryl 50 mg 1 hour prior to test . Patient must complete all four doses of above prophylactic medications. . Patient will need a ride after test due to  Benadryl.  On the Day of the Test: . Drink plenty of water. Do not drink any water within one hour of the test. . Do not eat any food 4 hours prior to the test. . You may take your regular medications prior to the test.  . Take metoprolol (Lopressor) two hours prior to test. . HOLD Furosemide/Hydrochlorothiazide morning of the  test. . FEMALES- please wear underwire-free bra if available      After the Test: . Drink plenty of water. . After receiving IV contrast, you may experience a mild flushed feeling. This is normal. . On occasion, you may experience a mild rash up to 24 hours after the test. This is not dangerous. If this occurs, you can take Benadryl 25 mg and increase your fluid intake. . If you experience trouble breathing, this can be serious. If it is severe call 911 IMMEDIATELY. If it is mild, please call our office. . If you take any of these medications: Glipizide/Metformin, Avandament, Glucavance, please do not take 48 hours after completing test unless otherwise instructed.   Once we have confirmed authorization from your insurance company, we will call you to set up a date and time for your test.   For non-scheduling related questions, please contact the cardiac imaging nurse navigator should you have any questions/concerns: Marchia Bond, Cardiac Imaging Nurse Navigator Burley Saver, Interim Cardiac Imaging Nurse [place] [facility] Direct Office Dial: [phone]   For scheduling needs, including cancellations and rescheduling, please call [phone].   Cardiac CT Angiogram A cardiac CT angiogram is a procedure to look at the heart and the area around the heart. It may be done to help find the cause of chest pains or other symptoms of heart disease. During this procedure, a substance called contrast dye is injected into the blood vessels in the area to be checked. A large X-ray machine, called a CT scanner, then takes detailed pictures of the heart and the surrounding area. The procedure is also sometimes called a coronary CT angiogram, coronary artery scanning, or CTA. A cardiac CT angiogram allows the health care provider to see how well blood is flowing to and from the heart. The health care provider will be able to see if there are any problems, such as:  Blockage  or narrowing of the coronary arteries in the heart.  Fluid around the heart.  Signs of weakness or disease in the muscles, valves, and tissues of the heart. Tell a health care provider about:  Any allergies you have. This is especially important if you have had a previous allergic reaction to contrast dye.  All medicines you are taking, including vitamins, herbs, eye drops, creams, and over-the-counter medicines.  Any blood disorders you have.  Any surgeries you have had.  Any medical conditions you have.  Whether you are pregnant or may be pregnant.  Any anxiety disorders, chronic pain, or other conditions you have that may increase your stress or prevent you from lying still. What are the risks? Generally, this is a safe procedure. However, problems may occur, including:  Bleeding.  Infection.  Allergic reactions to medicines or dyes.  Damage to other structures or organs.  Kidney damage from the contrast dye that is used.  Increased risk of cancer from radiation exposure. This risk is low. Talk with your health care provider about: ? The risks and benefits of testing. ? How you can receive the lowest dose of radiation. What happens before the procedure?  Wear comfortable clothing and remove any jewelry, glasses, dentures, and hearing aids.  Follow instructions from your health care provider about eating and drinking. This may include: ? For 12 hours before the procedure -- avoid caffeine. This includes tea, coffee, soda, energy drinks, and diet pills. Drink plenty of water or other fluids that do not have caffeine in them. Being well hydrated can prevent complications. ? For 4-6 hours before the procedure -- stop eating and drinking. The contrast dye can cause nausea, but this is less likely if your stomach is empty.  Ask your health care provider about changing or stopping your regular medicines. This is especially important if you are taking diabetes medicines, blood  thinners, or medicines to treat problems with erections (erectile dysfunction). What happens during the procedure?   Hair on your chest may need to be removed so that small sticky patches called electrodes can be placed on your chest. These will transmit information that helps to monitor your heart during the procedure.  An IV will be inserted into one of your veins.  You might be given a medicine to control your heart rate during the procedure. This will help to ensure that good images are obtained.  You will be asked to lie on an exam table. This table will slide in and out of the CT machine during the procedure.  Contrast dye will be injected into the IV. You might feel warm, or you may get a metallic taste in your mouth.  You will be given a medicine called nitroglycerin. This will relax or dilate the arteries in your heart.  The table that you are lying on will move into the CT machine tunnel for the scan.  The person running the machine will give you instructions while the scans are being done. You may be asked to: ? Keep your arms above your head. ? Hold your breath. ? Stay very still, even if the table is moving.  When the scanning is complete, you will be moved out of the machine.  The IV will be removed. The procedure may vary among health care providers and hospitals. What can I expect after the procedure? After your procedure, it is common to have:  A metallic taste in your mouth from the contrast dye.  A feeling of warmth.  A headache from the nitroglycerin. Follow these instructions at home:  Take over-the-counter and prescription medicines only as told by your health care provider.  If you are told, drink enough fluid to keep your urine pale yellow. This will help to flush the contrast dye out of your body.  Most people can return to their normal activities right after the procedure. Ask your health care provider what activities are safe for you.  It is up to  you to get the results of your procedure. Ask your health care provider, or the department that is doing the procedure, when your results will be ready.  Keep all follow-up visits as told by your health care provider. This is important. Contact a health care provider if:  You have any symptoms of allergy to the contrast dye. These include: ? Shortness of breath. ? Rash or hives. ? A racing heartbeat. Summary  A cardiac CT angiogram is a procedure to look at the heart and the area around the heart. It may be done to help find the cause of chest pains or other symptoms of heart disease.  During this procedure, a large X-ray machine, called a CT scanner,  takes detailed pictures of the heart and the surrounding area after a contrast dye has been injected into blood vessels in the area.  Ask your health care provider about changing or stopping your regular medicines before the procedure. This is especially important if you are taking diabetes medicines, blood thinners, or medicines to treat erectile dysfunction.  If you are told, drink enough fluid to keep your urine pale yellow. This will help to flush the contrast dye out of your body. This information is not intended to replace advice given to you by your health care provider. Make sure you discuss any questions you have with your health care provider. Document Revised: 10/18/2018 Document Reviewed: 10/18/2018 Elsevier Patient Education  [place].

## 2019-07-27 NOTE — Progress Notes (Signed)
Virtual Visit via Telephone Note   This visit type was conducted due to national recommendations for restrictions regarding the COVID-19 Pandemic (e.g. social distancing) in an effort to limit this patient's exposure and mitigate transmission in our community.  Due to her co-morbid illnesses, this patient is at least at moderate risk for complications without adequate follow up.  This format is felt to be most appropriate for this patient at this time.  The patient did not have access to video technology/had technical difficulties with video requiring transitioning to audio format only (telephone).  All issues noted in this document were discussed and addressed.  No physical exam could be performed with this format.  Please refer to the patient's chart for her  consent to telehealth for [facility].   The patient was identified using 2 identifiers.  Date:  0000000   ID:  [name], DOB [id number], MRN [id number]  Patient Location: Home Provider Location: Office  PCP:  Shon Baton, MD  Cardiologist:  No primary care provider on file.  Electrophysiologist:  None   Evaluation Performed:  Follow-Up Visit  Chief Complaint:  F/u abnormal stress test  History of Present Illness:    [name] is a 49 y.o. female with preeclampsia who presents today for evaluation of chest pain that occurred during a period of high work-related stress.   Her husband, Dr. Burt Knack who is a dentist joined [place] for the call.  We discussed results of ETT as noted below: ETT 07/25/19  Blood pressure demonstrated a normal response to exercise.  Horizontal ST segment depression ST segment depression of 1.5 mm was noted during stress in the V5 and V6 leads, and returning to baseline after less than 1 minute of recovery.   Impression:   1. Up to 2.5 mm horizontal ST depression in leads II, III, AVF and 1.5 mm horizontal ST depression in leads V5-6. STT changes resolved <1 min into recovery.  2.  Excellent functional capacity (10:21 min:s; 12.3 METS). 3. Normal BP/HR response to exercise.  4. No symptoms of CP despite ST-T changes described above. Excellent functional capacity. Possibly, these findings represent a false positive result. Would recommend CCTA or SPECT for clarification.   We discussed in detail the possibility of a false positive result. Further imaging discussed in detail including risks, benefits and decision making for each modality.   The patient does not have symptoms concerning for COVID-19 infection (fever, chills, cough, or new shortness of breath).    Past Medical History:  Diagnosis Date  . Abrasion of right knee 03/04/2016  . Allergy    runny nose  . Anxiety   . Hypertension    with pregnancy- preeclampsia ONLY   . 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 TMJ syndrome    Past Surgical History:  Procedure Laterality Date  . BREAST LUMPECTOMY WITH RADIOACTIVE SEED LOCALIZATION Right 03/10/2016   Procedure: RIGHT BREAST LUMPECTOMY WITH RADIOACTIVE SEED LOCALIZATION;  Surgeon: Autumn Messing III, MD;  Location: [facility];  Service: General;  Laterality: Right;  . CESAREAN SECTION  03/08/2004  . TOE SURGERY Right    age 49     Current Meds  Medication Sig  . Apoaequorin (PREVAGEN PO) Take by mouth. 3-4 x a week  . Ascorbic Acid (VITAMIN C) 500 MG CAPS Take by mouth as needed. 3 times a week  . ASHWAGANDHA PO Take by mouth daily.  . Cholecalciferol (VITAMIN D-3) 1000 units CAPS Take 1 capsule by mouth  3 (three) times a week.  . citalopram (CELEXA) 10 MG tablet Take 10 mg by mouth daily.  . eszopiclone (LUNESTA) 1 MG TABS tablet Take 1 mg by mouth at bedtime as needed for sleep. Take immediately before bedtime  . Misc Natural Products (APPLE CIDER VINEGAR DIET PO) Take by mouth. 1-2 gummies daily  . Multiple Vitamin (MULTIVITAMIN) tablet Take 1 tablet by mouth as directed. Once a week     Allergies:   Patient has no known allergies.   Social  History   Tobacco Use  . Smoking status: Never Smoker  . Smokeless tobacco: Never Used  Substance Use Topics  . Alcohol use: Yes    Comment: occasionally  . Drug use: No     Family Hx: The patient's family history includes Colon polyps in her father; Heart disease in her father; Melanoma in her sister; Pulmonary embolism in her mother. There is no history of Colon cancer, Esophageal cancer, Rectal cancer, or Stomach cancer.  ROS:   Please see the history of present illness.     All other systems reviewed and are negative.   Prior CV studies:   The following studies were reviewed today:  ETT, Echocardiogram  Labs/Other Tests and Data Reviewed:    EKG:  ETT ECG  Recent Labs: No results found for requested labs within last 8760 hours.   Recent Lipid Panel No results found for: CHOL, TRIG, HDL, CHOLHDL, LDLCALC, LDLDIRECT  Wt Readings from Last 3 Encounters:  07/27/19 150 lb (68 kg)  07/06/19 152 lb (68.9 kg)  06/27/19 151 lb (68.5 kg)     Objective:    Vital Signs:  BP 120/87   Ht 5\' 1"  (1.549 m)   Wt 150 lb (68 kg)   BMI 28.34 kg/m    VITAL SIGNS:  reviewed GEN:  no acute distress RESPIRATORY:  normal respiratory effort, no increased work of breathing NEURO:  alert and oriented x 3, speech normal PSYCH:  normal affect   ASSESSMENT & PLAN:    1. Abnormal stress electrocardiogram test using treadmill   2. Chest pain, unspecified type    We discussed in detail further workup of chest pain and abnormal stress ECG. We participated in shared decision making and determined next best test is CCTA. We will arrange this for her. Red flag symptoms to contact our office or present to the ED reviewed.   Reviewed normal echocardiogram.  COVID-19 Education: The signs and symptoms of COVID-19 were discussed with the patient and how to seek care for testing (follow up with PCP or arrange E-visit).  The importance of social distancing was discussed today.  Time:     Today, I have spent 25 minutes with the patient with telehealth technology discussing the above problems.     Medication Adjustments/Labs and Tests Ordered: Current medicines are reviewed at length with the patient today.  Concerns regarding medicines are outlined above.   Tests Ordered: No orders of the defined types were placed in this encounter.   Medication Changes: No orders of the defined types were placed in this encounter.   Follow Up:  After testing  Signed, Elouise Munroe, MD  07/27/2019 8:12 AM    [place]

## 2019-08-09 ENCOUNTER — Telehealth: Payer: Self-pay | Admitting: Internal Medicine

## 2019-08-09 NOTE — Telephone Encounter (Signed)
Called and spoke with pt, she states she was unsure what her 4 week appt was for since her coronary CT had not been scheduled yet. Notified that per Dr.Acharya's office note from her last visit, it advised her to follow up 4 weeks post Coronary CT. Notified that she should not have to keep this appt and that once her coronary CT is scheduled for her to call us so we can schedule her follow up. Notified I would clarify with Dr.Acharya's nurse and let her know when she notifies [place]. Pt verbalized understanding with no other questions at this time.

## 2019-08-09 NOTE — Telephone Encounter (Signed)
Patient is requesting to speak with Dr. Delphina Cahill nurse in regards to whether or not appointment scheduled for 08/14/19 is necessary. She states she was not aware that she needed a 1 month follow up appointment and she would like clarification. Please call to discuss.

## 2019-08-09 NOTE — Telephone Encounter (Signed)
SPOKE TO PATIENT . PATIENT IS AN AGREEMENT TO CHANGE APPOINTMENT UNTIL AFTER CCTA IS COMPLETED.     CHANGED TO VIRTUAL VISIT July 29 , 2021  PATIENT VERBALIZED UNDERSTANDING

## 2019-08-14 ENCOUNTER — Ambulatory Visit: Payer: 59 | Admitting: Internal Medicine

## 2019-08-20 ENCOUNTER — Telehealth: Payer: Self-pay | Admitting: *Deleted

## 2019-08-20 NOTE — Telephone Encounter (Signed)
See previous TE on 07-09-19.  Pt is being seen by cardiologist for chest pain.  She is scheduled to have a cardiac CT on 08-28-19 and follow up with cardiologist on 10-04-19.  Pt states that she will more than likely have her cardiology visit prior to the 10-04-19.  She is reluctant to push colonoscopy back any further into August.  PV is made for 09-20-19 at 900 and colonoscopy made for 09-26-19 at 1400.  She states understanding in that she will call office back regarding these appts if she hasn't received cardiology clearance prior to 09-20-19

## 2019-08-24 ENCOUNTER — Telehealth (HOSPITAL_COMMUNITY): Payer: Self-pay | Admitting: *Deleted

## 2019-08-24 NOTE — Telephone Encounter (Signed)
Attempted to call patient regarding upcoming cardiac CT appointment. Left message on voicemail with name and callback number  Lokelani Lutes Tai RN Navigator Cardiac Imaging [facility] [phone] Office [phone] Cell  

## 2019-08-27 ENCOUNTER — Telehealth (HOSPITAL_COMMUNITY): Payer: Self-pay | Admitting: *Deleted

## 2019-08-27 NOTE — Telephone Encounter (Signed)
Reaching out to patient to offer assistance regarding upcoming cardiac imaging study; pt verbalizes understanding of appt date/time, parking situation and where to check in, pre-test NPO status and medications ordered, and verified current allergies; name and call back number provided for further questions should they arise  Annaclaire Walsworth Tai RN Navigator Cardiac Imaging [facility] [phone] office [phone] cell 

## 2019-08-28 ENCOUNTER — Encounter (HOSPITAL_COMMUNITY): Payer: Self-pay

## 2019-08-28 ENCOUNTER — Ambulatory Visit (HOSPITAL_COMMUNITY)
Admission: RE | Admit: 2019-08-28 | Discharge: 2019-08-28 | Disposition: A | Discharge status: Home / Self Care | Payer: No Typology Code available for payment source | Source: Ambulatory Visit | Attending: Internal Medicine | Admitting: Internal Medicine

## 2019-08-28 ENCOUNTER — Other Ambulatory Visit: Payer: Self-pay

## 2019-08-28 ENCOUNTER — Encounter: Payer: No Typology Code available for payment source | Admitting: *Deleted

## 2019-08-28 DIAGNOSIS — I700 Atherosclerosis of aorta: Secondary | ICD-10-CM | POA: Insufficient documentation

## 2019-08-28 DIAGNOSIS — Z006 Encounter for examination for normal comparison and control in clinical research program: Secondary | ICD-10-CM

## 2019-08-28 DIAGNOSIS — R079 Chest pain, unspecified: Secondary | ICD-10-CM | POA: Insufficient documentation

## 2019-08-28 DIAGNOSIS — Z9189 Other specified personal risk factors, not elsewhere classified: Secondary | ICD-10-CM

## 2019-08-28 MED ORDER — NITROGLYCERIN 0.4 MG SL SUBL
SUBLINGUAL_TABLET | SUBLINGUAL | Status: AC
  Administered 2019-08-28: 0.8 mg via SUBLINGUAL
  Filled 2019-08-28: qty 2

## 2019-08-28 MED ORDER — IOHEXOL 350 MG/ML SOLN
80.0000 mL | Freq: Once | INTRAVENOUS | Status: AC | PRN
  Administered 2019-08-28: 80 mL via INTRAVENOUS

## 2019-08-28 MED ORDER — METOPROLOL TARTRATE 5 MG/5ML IV SOLN
INTRAVENOUS | Status: AC
  Filled 2019-08-28: qty 5

## 2019-08-28 MED ORDER — NITROGLYCERIN 0.4 MG SL SUBL: 0.8000 mg | SUBLINGUAL_TABLET | Freq: Once | SUBLINGUAL | Status: AC

## 2019-08-28 NOTE — Research (Signed)
CADFEM Informed Consent                  Subject Name:   [name]   Subject met inclusion and exclusion criteria.  The informed consent form, study requirements and expectations were reviewed with the subject and questions and concerns were addressed prior to the signing of the consent form.  The subject verbalized understanding of the trial requirements.  The subject agreed to participate in the CADFEM trial and signed the informed consent.  The informed consent was obtained prior to performance of any protocol-specific procedures for the subject.  A copy of the signed informed consent was given to the subject and a copy was placed in the subject's medical record.   [place] Jahanna Raether, Research Assistant  08/28/2019/ 07:10 a.m.

## 2019-08-29 NOTE — Telephone Encounter (Signed)
Pt called this am and stated she had received her CTA results via MyChart and they are normal.  She will be speaking with Dr. Margaretann Loveless virtually and I asked her to just make sure cardiologist puts a note in chart that ok to proceed with colonoscopy.  Understanding voiced.

## 2019-08-31 ENCOUNTER — Encounter: Payer: Self-pay | Admitting: Gastroenterology

## 2019-09-11 ENCOUNTER — Encounter: Payer: Self-pay | Admitting: Gastroenterology

## 2019-09-20 ENCOUNTER — Telehealth: Payer: Self-pay | Admitting: *Deleted

## 2019-09-20 ENCOUNTER — Encounter: Payer: Self-pay | Admitting: Gastroenterology

## 2019-09-20 ENCOUNTER — Ambulatory Visit: Payer: No Typology Code available for payment source | Admitting: *Deleted

## 2019-09-20 VITALS — Ht 62.0 in | Wt 145.0 lb

## 2019-09-20 DIAGNOSIS — Z1211 Encounter for screening for malignant neoplasm of colon: Secondary | ICD-10-CM

## 2019-09-20 NOTE — Telephone Encounter (Signed)
Looks safe to proceed with screening colonoscopy; pretty exhaustive cardiac workup to date.

## 2019-09-20 NOTE — Telephone Encounter (Signed)
Thank you :)

## 2019-09-20 NOTE — Telephone Encounter (Signed)
Dr. Ardis Hughs,  I Spoke with patient for [facility] for upcoming screening colonoscopy.  She is being followed by a cardiologist for chest pain.  She has had normal results from CTA, Echo and stress test.  Are we ok to proceed with colonoscopy?  Thank you,  Lanelle Bal

## 2019-09-20 NOTE — Progress Notes (Signed)
No egg or soy allergy known to patient  No issues with past sedation with any surgeries or procedures no intubation problems in the past  No diet pills per patient No home 02 use per patient  No blood thinners per patient  Pt denies issues with constipation  No A fib or A flutter  EMMI video to pt or MyChart  COVID 19 guidelines implemented in PV today   Due to the COVID-19 pandemic we are asking patients to follow these guidelines. Please only bring one care partner. Please be aware that your care partner may wait in the car in the parking lot or if they feel like they will be too hot to wait in the car, they may wait in the lobby on the 4th floor. All care partners are required to wear a mask the entire time (we do not have any that we can provide them), they need to practice social distancing, and we will do a Covid check for all patient's and care partners when you arrive. Also we will check their temperature and your temperature. If the care partner waits in their car they need to stay in the parking lot the entire time and we will call them on their cell phone when the patient is ready for discharge so they can bring the car to the front of the building. Also all patient's will need to wear a mask into building.  

## 2019-09-26 ENCOUNTER — Encounter: Payer: Self-pay | Admitting: Gastroenterology

## 2019-09-26 ENCOUNTER — Ambulatory Visit (AMBULATORY_SURGERY_CENTER): Payer: No Typology Code available for payment source | Admitting: Gastroenterology

## 2019-09-26 VITALS — BP 105/70 | HR 63 | Temp 96.8°F | Resp 11 | Ht 62.0 in | Wt 153.0 lb

## 2019-09-26 DIAGNOSIS — Z1211 Encounter for screening for malignant neoplasm of colon: Secondary | ICD-10-CM | POA: Diagnosis not present

## 2019-09-26 DIAGNOSIS — D120 Benign neoplasm of cecum: Secondary | ICD-10-CM

## 2019-09-26 DIAGNOSIS — D122 Benign neoplasm of ascending colon: Secondary | ICD-10-CM | POA: Diagnosis not present

## 2019-09-26 DIAGNOSIS — K6389 Other specified diseases of intestine: Secondary | ICD-10-CM | POA: Diagnosis not present

## 2019-09-26 MED ORDER — SODIUM CHLORIDE 0.9 % IV SOLN: 500.0000 mL | Freq: Once | INTRAVENOUS | Status: DC

## 2019-09-26 NOTE — Patient Instructions (Signed)
°  Please read all of the handouts given to you by your recovery room nurse.  YOU HAD AN ENDOSCOPIC PROCEDURE TODAY AT [facility]:   Refer to the procedure report that was given to you for any specific questions about what was found during the examination.  If the procedure report does not answer your questions, please call your gastroenterologist to clarify.  If you requested that your care partner not be given the details of your procedure findings, then the procedure report has been included in a sealed envelope for you to review at your convenience later.  YOU SHOULD EXPECT: Some feelings of bloating in the abdomen. Passage of more gas than usual.  Walking can help get rid of the air that was put into your GI tract during the procedure and reduce the bloating. If you had a lower endoscopy (such as a colonoscopy or flexible sigmoidoscopy) you may notice spotting of blood in your stool or on the toilet paper. If you underwent a bowel prep for your procedure, you may not have a normal bowel movement for a few days.  Please Note:  You might notice some irritation and congestion in your nose or some drainage.  This is from the oxygen used during your procedure.  There is no need for concern and it should clear up in a day or so.  SYMPTOMS TO REPORT IMMEDIATELY:   Following lower endoscopy (colonoscopy or flexible sigmoidoscopy):  Excessive amounts of blood in the stool  Significant tenderness or worsening of abdominal pains  Swelling of the abdomen that is new, acute  Fever of 100F or higher   For urgent or emergent issues, a gastroenterologist can be reached at any hour by calling [phone]. Do not use MyChart messaging for urgent concerns.    DIET:  We do recommend a small meal at first, but then you may proceed to your regular diet.  Drink plenty of fluids but you should avoid alcoholic beverages for 24 hours.  ACTIVITY:  You should plan to take it easy for the rest  of today and you should NOT DRIVE or use heavy machinery until tomorrow (because of the sedation medicines used during the test).    FOLLOW UP: Our staff will call the number listed on your records 48-72 hours following your procedure to check on you and address any questions or concerns that you may have regarding the information given to you following your procedure. If we do not reach you, we will leave a message.  We will attempt to reach you two times.  During this call, we will ask if you have developed any symptoms of COVID 19. If you develop any symptoms (ie: fever, flu-like symptoms, shortness of breath, cough etc.) before then, please call [phone].  If you test positive for Covid 19 in the 2 weeks post procedure, please call and report this information to [place].    If any biopsies were taken you will be contacted by phone or by letter within the next 1-3 weeks.  Please call us at [phone] if you have not heard about the biopsies in 3 weeks.    SIGNATURES/CONFIDENTIALITY: You and/or your care partner have signed paperwork which will be entered into your electronic medical record.  These signatures attest to the fact that that the information above on your After Visit Summary has been reviewed and is understood.  Full responsibility of the confidentiality of this discharge information lies with you and/or your care-partner.

## 2019-09-26 NOTE — Op Note (Signed)
[place] Patient Name: Erik Nessel Procedure Date: 09/26/2019 1:59 PM MRN: [id number] Endoscopist: Milus Banister , MD Age: 49 Referring MD:  Date of Birth: [birth year] Gender: Female Account #: [id number] Procedure:                Colonoscopy Indications:              Screening for colorectal malignant neoplasm Medicines:                Monitored Anesthesia Care Procedure:                Pre-Anesthesia Assessment:                           - Prior to the procedure, a History and Physical                            was performed, and patient medications and                            allergies were reviewed. The patient's tolerance of                            previous anesthesia was also reviewed. The risks                            and benefits of the procedure and the sedation                            options and risks were discussed with the patient.                            All questions were answered, and informed consent                            was obtained. Prior Anticoagulants: The patient has                            taken no previous anticoagulant or antiplatelet                            agents. ASA Grade Assessment: 1 - A healthly                            patient. After reviewing the risks and benefits,                            the patient was deemed in satisfactory condition to                            undergo the procedure.                           After obtaining informed consent, the colonoscope  was passed under direct vision. Throughout the                            procedure, the patient's blood pressure, pulse, and                            oxygen saturations were monitored continuously. The                            Colonoscope was introduced through the anus and                            advanced to the the cecum, identified by                            appendiceal orifice and ileocecal valve. The                             colonoscopy was performed without difficulty. The                            patient tolerated the procedure well. The quality                            of the bowel preparation was excellent. The                            ileocecal valve, appendiceal orifice, and rectum                            were photographed. Scope In: 2:06:03 PM Scope Out: 2:19:47 PM Scope Withdrawal Time: 0 hours 5 minutes 0 seconds  Total Procedure Duration: 0 hours 13 minutes 44 seconds  Findings:                 Two sessile polyps were found in the ascending                            colon and cecum. The polyps were 2 to 4 mm in size.                            These polyps were removed with a cold snare.                            Resection and retrieval were complete.                           The exam was otherwise without abnormality on                            direct and retroflexion views. Complications:            No immediate complications. Estimated blood loss:  None. Estimated Blood Loss:     Estimated blood loss: none. Impression:               - Two 2 to 4 mm polyps in the ascending colon and                            in the cecum, removed with a cold snare. Resected                            and retrieved.                           - The examination was otherwise normal on direct                            and retroflexion views. Recommendation:           - Patient has a contact number available for                            emergencies. The signs and symptoms of potential                            delayed complications were discussed with the                            patient. Return to normal activities tomorrow.                            Written discharge instructions were provided to the                            patient.                           - Resume previous diet.                           - Continue present  medications.                           - Await pathology results. Milus Banister, MD 09/26/2019 2:22:17 PM This report has been signed electronically.

## 2019-09-26 NOTE — Progress Notes (Signed)
Patient ID: [name], female   DOB: [birth year], 49 y.o.   MRN: [id number] Front desk ss Vitals [place]

## 2019-09-26 NOTE — Progress Notes (Signed)
Report given to PACU, vss 

## 2019-09-28 ENCOUNTER — Telehealth: Payer: Self-pay

## 2019-09-28 NOTE — Telephone Encounter (Signed)
Left message on follow up call. 

## 2019-09-28 NOTE — Telephone Encounter (Signed)
Pt called stating she is feeling fine and will call if anything changes.

## 2019-10-03 ENCOUNTER — Encounter: Payer: Self-pay | Admitting: Gastroenterology

## 2019-10-04 ENCOUNTER — Telehealth (INDEPENDENT_AMBULATORY_CARE_PROVIDER_SITE_OTHER): Payer: No Typology Code available for payment source | Admitting: Internal Medicine

## 2019-10-04 ENCOUNTER — Encounter: Payer: Self-pay | Admitting: Internal Medicine

## 2019-10-04 VITALS — Ht 62.0 in | Wt 150.0 lb

## 2019-10-04 DIAGNOSIS — Z9189 Other specified personal risk factors, not elsewhere classified: Secondary | ICD-10-CM | POA: Diagnosis not present

## 2019-10-04 DIAGNOSIS — Z7189 Other specified counseling: Secondary | ICD-10-CM | POA: Diagnosis not present

## 2019-10-04 DIAGNOSIS — R079 Chest pain, unspecified: Secondary | ICD-10-CM

## 2019-10-04 DIAGNOSIS — R9439 Abnormal result of other cardiovascular function study: Secondary | ICD-10-CM | POA: Diagnosis not present

## 2019-10-04 NOTE — Patient Instructions (Signed)
Medication Instructions:  No changes *If you need a refill on your cardiac medications before your next appointment, please call your pharmacy*   Lab Work: None ordered If you have labs (blood work) drawn today and your tests are completely normal, you will receive your results only by: Marland Kitchen MyChart Message (if you have MyChart) OR . A paper copy in the mail If you have any lab test that is abnormal or we need to change your treatment, we will call you to review the results.   Testing/Procedures: None ordered   Follow-Up: At [facility], you and your health needs are our priority.  As part of our continuing mission to provide you with exceptional heart care, we have created designated Provider Care Teams.  These Care Teams include your primary Cardiologist (physician) and Advanced Practice Providers (APPs -  Physician Assistants and Nurse Practitioners) who all work together to provide you with the care you need, when you need it.  We recommend signing up for the patient portal called "MyChart".  Sign up information is provided on this After Visit Summary.  MyChart is used to connect with patients for Virtual Visits (Telemedicine).  Patients are able to view lab/test results, encounter notes, upcoming appointments, etc.  Non-urgent messages can be sent to your provider as well.   To learn more about what you can do with MyChart, go to [email or web address].    Your next appointment:   6 month(s)  The format for your next appointment:   In Person  Provider:   You may see Dr. Margaretann Loveless or one of the following Advanced Practice Providers on your designated Care Team:    Rosaria Ferries, PA-C  Jory Sims, DNP, ANP  Cadence Kathlen Mody, PA-C

## 2019-10-04 NOTE — Progress Notes (Signed)
Virtual Visit via Telephone Note   This visit type was conducted due to national recommendations for restrictions regarding the COVID-19 Pandemic (e.g. social distancing) in an effort to limit this patient's exposure and mitigate transmission in our community.  Due to her co-morbid illnesses, this patient is at least at moderate risk for complications without adequate follow up.  This format is felt to be most appropriate for this patient at this time.  The patient did not have access to video technology/had technical difficulties with video requiring transitioning to audio format only (telephone).  All issues noted in this document were discussed and addressed.  No physical exam could be performed with this format.  Please refer to the patient's chart for her  consent to telehealth for [facility].    Date:  9/51/8841   ID:  [name], DOB [birth year], MRN [id number] The patient was identified using 2 identifiers.  Patient Location: Other:  Office (Home or On Location) Provider Location: Home Office  PCP:  Shon Baton, MD  Cardiologist:  No primary care provider on file.  Electrophysiologist:  None   Evaluation Performed:  Follow-Up Visit  Chief Complaint:  F/u testing  History of Present Illness:    [name] is a 49 y.o. female with history of preeclampsia who presents for follow up of testing.   We reviewed CCTA which showed no coronary calcium and no coronary atherosclerotic plaque.  Radiologist review of extracardiac findings suggest aortic atherosclerosis.  I have independently reviewed these images which show scant atherosclerotic plaque.  We have discussed that overall her risk is quite low and testing is reassuring after abnormal stress ECG.  She has had no recurrence of chest pain and denies shortness of breath, palpitations, PND, orthopnea, leg swelling.  Denies syncope or presyncope.  The patient does not have symptoms concerning for COVID-19 infection  (fever, chills, cough, or new shortness of breath).    Past Medical History:  Diagnosis Date  . Abrasion of right knee 03/04/2016  . Allergy    runny nose  . Anxiety   . Hypertension    with pregnancy- preeclampsia ONLY   . 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 TMJ syndrome    Past Surgical History:  Procedure Laterality Date  . BREAST LUMPECTOMY WITH RADIOACTIVE SEED LOCALIZATION Right 03/10/2016   Procedure: RIGHT BREAST LUMPECTOMY WITH RADIOACTIVE SEED LOCALIZATION;  Surgeon: Autumn Messing III, MD;  Location: [place];  Service: General;  Laterality: Right;  . CESAREAN SECTION  03/08/2004  . TOE SURGERY Right    age 50     Current Meds  Medication Sig  . Apoaequorin (PREVAGEN PO) Take by mouth. 3-4 x a week  . Ascorbic Acid (VITAMIN C) 500 MG CAPS Take by mouth as needed. 3 times a week  . ASHWAGANDHA PO Take by mouth daily.  . Cholecalciferol (VITAMIN D-3) 1000 units CAPS Take 1 capsule by mouth 3 (three) times a week.  . citalopram (CELEXA) 10 MG tablet Take 10 mg by mouth daily.  . eszopiclone (LUNESTA) 1 MG TABS tablet Take 1 mg by mouth at bedtime as needed for sleep. Take immediately before bedtime  . Misc Natural Products (APPLE CIDER VINEGAR DIET PO) Take by mouth. 1-2 gummies daily  . Multiple Vitamin (MULTIVITAMIN) tablet Take 1 tablet by mouth as directed. Once a week  . vitamin B-12 (CYANOCOBALAMIN) 100 MCG tablet Take 100 mcg by mouth daily.     Allergies:   Patient has no  known allergies.   Social History   Tobacco Use  . Smoking status: Never Smoker  . Smokeless tobacco: Never Used  Substance Use Topics  . Alcohol use: Yes    Comment: occasionally 2 times a month  . Drug use: No     Family Hx: The patient's family history includes Colon polyps in her father; Heart disease in her father; Melanoma in her sister; Pulmonary embolism in her mother. There is no history of Colon cancer, Esophageal cancer, Rectal cancer, or Stomach cancer.  ROS:    Please see the history of present illness.     All other systems reviewed and are negative.   Prior CV studies:   The following studies were reviewed today:    Labs/Other Tests and Data Reviewed:    EKG:  No ECG reviewed.  Recent Labs: No results found for requested labs within last 8760 hours.   Recent Lipid Panel No results found for: CHOL, TRIG, HDL, CHOLHDL, LDLCALC, LDLDIRECT  Wt Readings from Last 3 Encounters:  10/04/19 150 lb (68 kg)  09/26/19 153 lb (69.4 kg)  09/20/19 145 lb (65.8 kg)     Objective:    Vital Signs:  Ht 5\' 2"  (1.575 m)   Wt 150 lb (68 kg)   BMI 27.44 kg/m    VITAL SIGNS:  reviewed GEN:  no acute distress RESPIRATORY:  normal respiratory effort, no increased work of breathing NEURO:  alert and oriented x 3, speech normal PSYCH:  normal affect   ASSESSMENT & PLAN:    1. Abnormal stress electrocardiogram test using treadmill   2. Chest pain, unspecified type   3. Cardiovascular risk factor   4. Cardiac risk counseling    Stress ECG was abnormal but CCTA shows no coronary artery disease.  There is minimal atherosclerotic plaque noted by the radiologist on extracardiac finding review.  We have discussed that this can be monitored, and can consider initiating statin therapy, however lifestyle modification can continue to be pursued given no coronary artery disease.  She maintains a healthy lifestyle.  Would recommend regular cardiology follow-up given history of preeclampsia which can be a risk factor for cardiovascular disease in women.  Findings discussed in detail and all questions answered.   COVID-19 Education: The signs and symptoms of COVID-19 were discussed with the patient and how to seek care for testing (follow up with PCP or arrange E-visit).  The importance of social distancing was discussed today.  Time:   Today, I have spent 15 minutes with the patient with telehealth technology discussing the above problems.  Patient  Instructions  Medication Instructions:  No changes *If you need a refill on your cardiac medications before your next appointment, please call your pharmacy*   Lab Work: None ordered If you have labs (blood work) drawn today and your tests are completely normal, you will receive your results only by: Marland Kitchen MyChart Message (if you have MyChart) OR . A paper copy in the mail If you have any lab test that is abnormal or we need to change your treatment, we will call you to review the results.   Testing/Procedures: None ordered   Follow-Up: At [facility], you and your health needs are our priority.  As part of our continuing mission to provide you with exceptional heart care, we have created designated Provider Care Teams.  These Care Teams include your primary Cardiologist (physician) and Advanced Practice Providers (APPs -  Physician Assistants and Nurse Practitioners) who all work together to  provide you with the care you need, when you need it.  We recommend signing up for the patient portal called "MyChart".  Sign up information is provided on this After Visit Summary.  MyChart is used to connect with patients for Virtual Visits (Telemedicine).  Patients are able to view lab/test results, encounter notes, upcoming appointments, etc.  Non-urgent messages can be sent to your provider as well.   To learn more about what you can do with MyChart, go to [email or web address].    Your next appointment:   6 month(s)  The format for your next appointment:   In Person  Provider:   You may see Dr. Margaretann Loveless or one of the following Advanced Practice Providers on your designated Care Team:    Rosaria Ferries, PA-C  Jory Sims, DNP, ANP  Cadence Kathlen Mody, PA-C      Signed, Elouise Munroe, MD  10/04/2019 9:20 AM    [place]

## 2020-01-29 ENCOUNTER — Ambulatory Visit: Payer: No Typology Code available for payment source | Attending: Internal Medicine

## 2020-01-29 DIAGNOSIS — Z23 Encounter for immunization: Secondary | ICD-10-CM

## 2020-01-29 NOTE — Progress Notes (Signed)
   Covid-19 Vaccination Clinic  Name:  [name]    MRN: [id number] DOB: 09/29/1970  01/29/2020  [name] was observed post Covid-19 immunization for 15 minutes without incident. She was provided with Vaccine Information Sheet and instruction to access the V-Safe system.   [name] was instructed to call 911 with any severe reactions post vaccine: Marland Kitchen Difficulty breathing  . Swelling of face and throat  . A fast heartbeat  . A bad rash all over body  . Dizziness and weakness   Immunizations Administered    No immunizations on file.

## 2020-03-26 ENCOUNTER — Encounter: Payer: Self-pay | Admitting: Internal Medicine

## 2020-03-26 NOTE — Telephone Encounter (Signed)
error 

## 2020-04-01 ENCOUNTER — Ambulatory Visit: Payer: No Typology Code available for payment source | Admitting: Internal Medicine

## 2020-04-01 ENCOUNTER — Other Ambulatory Visit: Payer: Self-pay

## 2020-04-01 ENCOUNTER — Encounter: Payer: Self-pay | Admitting: Internal Medicine

## 2020-04-01 VITALS — BP 128/76 | HR 74 | Ht 62.0 in | Wt 164.0 lb

## 2020-04-01 DIAGNOSIS — Z9189 Other specified personal risk factors, not elsewhere classified: Secondary | ICD-10-CM | POA: Diagnosis not present

## 2020-04-01 DIAGNOSIS — R079 Chest pain, unspecified: Secondary | ICD-10-CM | POA: Diagnosis not present

## 2020-04-01 NOTE — Progress Notes (Signed)
Cardiology Office Note:    Date:  0/62/3762   ID:  [name], DOB [birth year], MRN [id number]  PCP:  Shon Baton, MD  Cardiologist:  Elouise Munroe, MD  Electrophysiologist:  None   Referring MD: Shon Baton, MD   Chief Complaint/Reason for Referral: F/u chest pain  History of Present Illness:    [name] is a 50 y.o. female known to my clinic.  The patient denies chest pain, chest pressure, dyspnea at rest or with exertion, palpitations, PND, orthopnea, or leg swelling. Denies cough, fever, chills. Denies nausea, vomiting. Denies syncope or presyncope. Denies dizziness or lightheadedness.   Feeling well overall. Staying active and trying to reduce stress.   Past Medical History:  Diagnosis Date  . Abrasion of right knee 03/04/2016  . Allergy    runny nose  . Anxiety   . Hypertension    with pregnancy- preeclampsia ONLY   . 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 TMJ syndrome     Past Surgical History:  Procedure Laterality Date  . BREAST LUMPECTOMY WITH RADIOACTIVE SEED LOCALIZATION Right 03/10/2016   Procedure: RIGHT BREAST LUMPECTOMY WITH RADIOACTIVE SEED LOCALIZATION;  Surgeon: Autumn Messing III, MD;  Location: [place];  Service: General;  Laterality: Right;  . CESAREAN SECTION  03/08/2004  . TOE SURGERY Right    age 22    Current Medications: Current Meds  Medication Sig  . Ascorbic Acid (VITAMIN C) 500 MG CAPS Take by mouth as needed. 3 times a week  . Cholecalciferol (VITAMIN D-3) 1000 units CAPS Take 1 capsule by mouth 3 (three) times a week.  . citalopram (CELEXA) 10 MG tablet Take 10 mg by mouth daily.  . Multiple Vitamin (MULTIVITAMIN) tablet Take 1 tablet by mouth as directed. Once a week     Allergies:   Patient has no known allergies.   Social History   Tobacco Use  . Smoking status: Never Smoker  . Smokeless tobacco: Never Used  Substance Use Topics  . Alcohol use: Yes    Comment: occasionally 2 times a month  .  Drug use: No     Family History: The patient's family history includes Colon polyps in her father; Heart disease in her father; Melanoma in her sister; Pulmonary embolism in her mother. There is no history of Colon cancer, Esophageal cancer, Rectal cancer, or Stomach cancer.  ROS:   Please see the history of present illness.    All other systems reviewed and are negative.  EKGs/Labs/Other Studies Reviewed:    The following studies were reviewed today:  EKG:  NSR   Recent Labs: No results found for requested labs within last 8760 hours.  Recent Lipid Panel No results found for: CHOL, TRIG, HDL, CHOLHDL, VLDL, LDLCALC, LDLDIRECT  Physical Exam:    VS:  BP 128/76 (BP Location: Left Arm, Patient Position: Sitting)   Pulse 74   Ht 5\' 2"  (1.575 m)   Wt 164 lb (74.4 kg)   SpO2 99%   BMI 30.00 kg/m     Wt Readings from Last 5 Encounters:  04/01/20 164 lb (74.4 kg)  10/04/19 150 lb (68 kg)  09/26/19 153 lb (69.4 kg)  09/20/19 145 lb (65.8 kg)  07/27/19 150 lb (68 kg)    Constitutional: No acute distress Eyes: sclera non-icteric, normal conjunctiva and lids ENMT: normal dentition, moist mucous membranes Cardiovascular: regular rhythm, normal rate, no murmurs. S1 and S2 normal. Radial pulses normal bilaterally. No jugular venous distention.  Respiratory: clear to auscultation bilaterally GI : normal bowel sounds, soft and nontender. No distention.   MSK: extremities warm, well perfused. No edema.  NEURO: grossly nonfocal exam, moves all extremities. PSYCH: alert and oriented x 3, normal mood and affect.   ASSESSMENT:    1. Chest pain, unspecified type   2. Cardiovascular risk factor    PLAN:    Chest pain, unspecified type  Cardiovascular risk factor - Plan: EKG 12-Lead   No significant recurrence. She is doing well overall. Follow up prn per patient preference.  Total time of encounter: 20 minutes total time of encounter, including 15 minutes spent in face-to-face  patient care on the date of this encounter. This time includes coordination of care and counseling regarding above mentioned problem list. Remainder of non-face-to-face time involved reviewing chart documents/testing relevant to the patient encounter and documentation in the medical record. I have independently reviewed documentation from referring provider.   Cherlynn Kaiser, MD [place]  [facility]    Medication Adjustments/Labs and Tests Ordered: Current medicines are reviewed at length with the patient today.  Concerns regarding medicines are outlined above.   Orders Placed This Encounter  Procedures  . EKG 12-Lead    No orders of the defined types were placed in this encounter.   Patient Instructions  Medication Instructions:  No Changes In Medications at this time.  *If you need a refill on your cardiac medications before your next appointment, please call your pharmacy*  Follow-Up: At [facility], you and your health needs are our priority.  As part of our continuing mission to provide you with exceptional heart care, we have created designated Provider Care Teams.  These Care Teams include your primary Cardiologist (physician) and Advanced Practice Providers (APPs -  Physician Assistants and Nurse Practitioners) who all work together to provide you with the care you need, when you need it.  Your next appointment:   AS NEEDED   The format for your next appointment:   In Person  Provider:   Cherlynn Kaiser, MD

## 2020-04-01 NOTE — Patient Instructions (Signed)
Medication Instructions:  No Changes In Medications at this time.  *If you need a refill on your cardiac medications before your next appointment, please call your pharmacy*  Follow-Up: At [facility], you and your health needs are our priority.  As part of our continuing mission to provide you with exceptional heart care, we have created designated Provider Care Teams.  These Care Teams include your primary Cardiologist (physician) and Advanced Practice Providers (APPs -  Physician Assistants and Nurse Practitioners) who all work together to provide you with the care you need, when you need it.  Your next appointment:   AS NEEDED   The format for your next appointment:   In Person  Provider:   Cherlynn Kaiser, MD

## 2020-04-17 ENCOUNTER — Institutional Professional Consult (permissible substitution): Payer: No Typology Code available for payment source | Admitting: Neurology

## 2021-06-24 ENCOUNTER — Ambulatory Visit: Payer: No Typology Code available for payment source | Admitting: Podiatry

## 2021-08-10 NOTE — Therapy (Incomplete)
OUTPATIENT PHYSICAL THERAPY SHOULDER EVALUATION   Patient Name: [name] MRN: [id number] DOB:December 28, 1970, 51 y.o., female Today's Date: 08/10/2021    Past Medical History:  Diagnosis Date   Abrasion of right knee 03/04/2016   Allergy    runny nose   Anxiety    Hypertension    with pregnancy- preeclampsia ONLY    Papilloma of right breast 02/2016   TMJ syndrome    Past Surgical History:  Procedure Laterality Date   BREAST LUMPECTOMY WITH RADIOACTIVE SEED LOCALIZATION Right 03/10/2016   Procedure: RIGHT BREAST LUMPECTOMY WITH RADIOACTIVE SEED LOCALIZATION;  Surgeon: Autumn Messing III, MD;  Location: [facility];  Service: General;  Laterality: Right;   CESAREAN SECTION  03/08/2004   TOE SURGERY Right    age 72   There are no problems to display for this patient.   PCP: ***  REFERRING PROVIDER: Pedro Earls, MD   REFERRING DIAG: M25.511 (ICD-10-CM) - Pain in right shoulder   THERAPY DIAG:  No diagnosis found.  Rationale for Evaluation and Treatment Rehabilitation  ONSET DATE: MD order 07/03/2021  SUBJECTIVE:                                                                                                                                                                                      SUBJECTIVE STATEMENT: ***  PERTINENT HISTORY: L knee pain?  PAIN:  Are you having pain? {OPRCPAIN:27236}  PRECAUTIONS: {Therapy precautions:24002}  WEIGHT BEARING RESTRICTIONS {Yes ***/No:24003}  FALLS:  Has patient fallen in last 6 months? {fallsyesno:27318}  LIVING ENVIRONMENT: Lives with: {OPRC lives with:25569::"lives with their family"} Lives in: {Lives in:25570} Stairs: {opstairs:27293} Has following equipment at home: {Assistive devices:23999}  OCCUPATION: ***  PLOF: {PLOF:24004}  PATIENT GOALS ***  OBJECTIVE:   DIAGNOSTIC FINDINGS:  ***  PATIENT SURVEYS:  {rehab surveys:24030:a}  COGNITION:  Overall cognitive status:  {cognition:24006}     SENSATION: {sensation:27233}  POSTURE: ***  UPPER EXTREMITY ROM:   {AROM/PROM:27142} ROM Right eval Left eval  Shoulder flexion    Shoulder extension    Shoulder abduction    Shoulder adduction    Shoulder internal rotation    Shoulder external rotation    Elbow flexion    Elbow extension    Wrist flexion    Wrist extension    Wrist ulnar deviation    Wrist radial deviation    Wrist pronation    Wrist supination    (Blank rows = not tested)  UPPER EXTREMITY MMT:  MMT Right eval Left eval  Shoulder flexion    Shoulder extension    Shoulder abduction    Shoulder adduction    Shoulder internal  rotation    Shoulder external rotation    Middle trapezius    Lower trapezius    Elbow flexion    Elbow extension    Wrist flexion    Wrist extension    Wrist ulnar deviation    Wrist radial deviation    Wrist pronation    Wrist supination    Grip strength (lbs)    (Blank rows = not tested)  SHOULDER SPECIAL TESTS:  Impingement tests: {shoulder impingement test:25231:a}  SLAP lesions: {SLAP lesions:25232}  Instability tests: {shoulder instability test:25233}  Rotator cuff assessment: {rotator cuff assessment:25234}  Biceps assessment: {biceps assessment:25235}  JOINT MOBILITY TESTING:  ***  PALPATION:  ***   TODAY'S TREATMENT:  ***   PATIENT EDUCATION: Education details: *** Person educated: {Person educated:25204} Education method: {Education Method:25205} Education comprehension: {Education Comprehension:25206}   HOME EXERCISE PROGRAM: ***  ASSESSMENT:  CLINICAL IMPRESSION: Patient is a *** y.o. *** who was seen today for physical therapy evaluation and treatment for ***.    OBJECTIVE IMPAIRMENTS {opptimpairments:25111}.   ACTIVITY LIMITATIONS {activitylimitations:27494}  PARTICIPATION LIMITATIONS: {participationrestrictions:25113}  PERSONAL FACTORS {Personal factors:25162} are also affecting patient's functional  outcome.   REHAB POTENTIAL: {rehabpotential:25112}  CLINICAL DECISION MAKING: {clinical decision making:25114}  EVALUATION COMPLEXITY: {Evaluation complexity:25115}   GOALS: Goals reviewed with patient? {yes/no:20286}  SHORT TERM GOALS: Target date: {follow up:25551}  (Remove Blue Hyperlink)  *** Baseline: Goal status: {GOALSTATUS:25110}  2.  *** Baseline:  Goal status: {GOALSTATUS:25110}  3.  *** Baseline:  Goal status: {GOALSTATUS:25110}  4.  *** Baseline:  Goal status: {GOALSTATUS:25110}  5.  *** Baseline:  Goal status: {GOALSTATUS:25110}  6.  *** Baseline:  Goal status: {GOALSTATUS:25110}  LONG TERM GOALS: Target date: {follow up:25551}  (Remove Blue Hyperlink)  *** Baseline:  Goal status: {GOALSTATUS:25110}  2.  *** Baseline:  Goal status: {GOALSTATUS:25110}  3.  *** Baseline:  Goal status: {GOALSTATUS:25110}  4.  *** Baseline:  Goal status: {GOALSTATUS:25110}  5.  *** Baseline:  Goal status: {GOALSTATUS:25110}  6.  *** Baseline:  Goal status: {GOALSTATUS:25110}   PLAN: PT FREQUENCY: {rehab frequency:25116}  PT DURATION: {rehab duration:25117}  PLANNED INTERVENTIONS: {rehab planned interventions:25118::"Therapeutic exercises","Therapeutic activity","Neuromuscular re-education","Balance training","Gait training","Patient/Family education","Joint mobilization"}  PLAN FOR NEXT SESSION: ***   Ronny Flurry, PT 08/10/2021, 11:22 PM

## 2021-08-11 ENCOUNTER — Ambulatory Visit (HOSPITAL_BASED_OUTPATIENT_CLINIC_OR_DEPARTMENT_OTHER): Payer: No Typology Code available for payment source | Attending: Sports Medicine | Admitting: Physical Therapy

## 2021-08-11 DIAGNOSIS — M79672 Pain in left foot: Secondary | ICD-10-CM | POA: Insufficient documentation

## 2021-08-11 DIAGNOSIS — M25672 Stiffness of left ankle, not elsewhere classified: Secondary | ICD-10-CM | POA: Insufficient documentation

## 2021-08-11 DIAGNOSIS — M6281 Muscle weakness (generalized): Secondary | ICD-10-CM | POA: Insufficient documentation

## 2021-08-11 NOTE — Therapy (Signed)
OUTPATIENT PHYSICAL THERAPY LOWER EXTREMITY EVALUATION   Patient Name: [name] MRN: [id number] DOB:03-Jun-1970, 51 y.o., female Today's Date: 08/12/2021   PT End of Session - 08/11/21 0918     Visit Number 1    Number of Visits 8    Date for PT Re-Evaluation 09/15/21    Authorization Type AETNA    PT Start Time 0852    PT Stop Time 0944    PT Time Calculation (min) 52 min    Activity Tolerance Patient tolerated treatment well    Behavior During Therapy [facility] for tasks assessed/performed             Past Medical History:  Diagnosis Date   Abrasion of right knee 03/04/2016   Allergy    runny nose   Anxiety    Hypertension    with pregnancy- preeclampsia ONLY    Papilloma of right breast 02/2016   TMJ syndrome    Past Surgical History:  Procedure Laterality Date   BREAST LUMPECTOMY WITH RADIOACTIVE SEED LOCALIZATION Right 03/10/2016   Procedure: RIGHT BREAST LUMPECTOMY WITH RADIOACTIVE SEED LOCALIZATION;  Surgeon: Autumn Messing III, MD;  Location: [facility];  Service: General;  Laterality: Right;   CESAREAN SECTION  03/08/2004   TOE SURGERY Right    age 56   There are no problems to display for this patient.   PCP: Shon Baton, MD  REFERRING PROVIDER: Pedro Earls, MD     THERAPY DIAG:  Pain in left foot  Stiffness of left ankle, not elsewhere classified  Rationale for Evaluation and Treatment Rehabilitation  ONSET DATE: Late January/Early February 2023  SUBJECTIVE:   SUBJECTIVE STATEMENT: Pt denies any specific MOI.  Pt reports her pain is worse in lateral foot which wraps under foot to medial foot.  Pt states she occasionally has a hot sensation and feels like needles in R great toe MTP.  Pt saw MD on 07/03/2021 who was leaning toward plantar fasciitis.  MD ordered PT and recommended acupuncture.  MD script indicated to treat L foot.  Her pain is worse at night.  Pt has stiffness in AM.  Worst pain is with running, wearing heels, or  at night.  Pt typically runs 5-6 miles per week.  Pt works out with trainer 2x/wk. She doesn't have to wear heels as much.   Pt is not limited with ambulation due to foot pain.   Pt states her foot feels a little better with rubbing her foot though feels stiff afterwards.  Pt feels better in the heated pool.  Pt rolls her plantar fascia feels better.    Pt wears [employer] to work out/run in.  She wears [employer] around the house.  Pt states she is double jointed in her ankles.     PERTINENT HISTORY: none  PAIN:  Are you having pain? Yes Current:  1-2/10, Worst: 8/10, Best:  1-2/10  Location:  L lateral > medial foot and plantar aspect  PRECAUTIONS: None  WEIGHT BEARING RESTRICTIONS No  FALLS:  Has patient fallen in last 6 months? No   OCCUPATION: works in [place], primarily sedentary  PLOF: Independent; Pt was able to run and perform her normal functional mobility skills without L foot pain.   PATIENT GOALS improve pain, continue to run   OBJECTIVE:   DIAGNOSTIC FINDINGS: Pt denies having any x rays.  PATIENT SURVEYS:  LEFS 73.5/80  COGNITION:  Overall cognitive status: Within functional limits for tasks assessed  PALPATION: Pt had no tenderness in palpation t/o ankle, foot, heel, and plantar fascia Pt has tightness in bilat plantar fascia worse on L.   LOWER EXTREMITY ROM:  AROM/PROM Right eval Left eval  Hip flexion    Hip extension    Hip abduction    Hip adduction    Hip internal rotation    Hip external rotation    Knee flexion    Knee extension    Ankle dorsiflexion 8 4/12  Ankle plantarflexion 72 75  Ankle inversion 40 50  Ankle eversion 10 8/7   (Blank rows = not tested)  LOWER EXTREMITY MMT:  MMT Right eval Left eval  Hip flexion    Hip extension    Hip abduction    Hip adduction    Hip internal rotation    Hip external rotation    Knee flexion    Knee extension    Ankle dorsiflexion 5/5 5/5  Ankle plantarflexion WFL, tested in  sitting WFL, tested in sitting  Ankle inversion 5/5 5/5  Ankle eversion 5/5 4/5   (Blank rows = not tested)   GAIT: Assistive device utilized: None Level of assistance: Complete Independence Comments: Pt ambulates with a normalized heel to toe gait pattern without limping.     TODAY'S TREATMENT: Pt performed plantar fascia stretch 1x20-30 sec and longsitting gastroc stretch with strap 2x20-30 sec.  Pt received a HEP handout and was educated in correct form and appropriate frequency.  Pt instructed to not perform stretches into a painful range.      PATIENT EDUCATION:  Education details: objective findings, HEP, POC, exercise rationale, and exercise form.  Instructed pt to continue using roller at home.  Person educated: Patient Education method: Explanation, Demonstration, Tactile cues, Verbal cues, and Handouts Education comprehension: verbalized understanding, returned demonstration, verbal cues required, tactile cues required, and needs further education   HOME EXERCISE PROGRAM: Access Code: [email or web address] URL: [email or web address] Date: 08/12/2021 Prepared by: Ronny Flurry  Exercises - Seated Plantar Fascia Stretch  - 2 x daily - 7 x weekly - 2-3 reps - 20 - 30 seconds hold - Long Sitting Calf Stretch with Strap  - 2 x daily - 7 x weekly - 2-3 reps - 20-30 second hold  ASSESSMENT:  CLINICAL IMPRESSION: Patient is a 51 y.o. female presenting to the clinic with c/o's of L foot pain.  Pt states her pain is worse at night and has stiffness 1st thing in the AM.  Pt has increased pain with running and typically runs 5-6 miles per week.  She has increased pain with wearing heels though states she doesn't have to wear heels as much.  Pt has weakness in L ankle eversion and limited L ankle DF and Eversion ROM.  Pt should benefit from skilled PT services to address impairments and improve overall function.    OBJECTIVE IMPAIRMENTS decreased activity tolerance, decreased  ROM, decreased strength, hypomobility, impaired flexibility, and pain.   ACTIVITY LIMITATIONS  running  PARTICIPATION LIMITATIONS:  workout activities   REHAB POTENTIAL: Good  CLINICAL DECISION MAKING: Stable/uncomplicated  EVALUATION COMPLEXITY: Low   GOALS:  SHORT TERM GOALS: Target date: 09/01/2021  Pt will be independent and compliant with HEP for improved pain, strength, ROM, and function.  Baseline: Goal status: INITIAL  2.  Pt will demo at least 12 deg of L ankle DF AROM for improved stiffness and mobility Baseline:  Goal status: INITIAL  3.  Pt will report at least a 25% improvement in pain and sx's  overall. Baseline:  Goal status: INITIAL   LONG TERM GOALS: Target date: 09/22/2021  Pt will demo improved eversion strength to 5/5 MMT for improved tolerance with and performance of workout activities including running. Baseline:  Goal status: INITIAL  2.  Pt will report she is able to perform her normal workout activities including running without significant pain. Baseline:  Goal status: INITIAL  3.  Pt will report at least a 75% improvement in her pain at night and stiffness in the AM.  Baseline:  Goal status: INITIAL     PLAN: PT FREQUENCY: 1-2x/week  PT DURATION: 5 weeks  PLANNED INTERVENTIONS: Therapeutic exercises, Therapeutic activity, Neuromuscular re-education, Balance training, Gait training, Patient/Family education, Joint mobilization, Stair training, Aquatic Therapy, Dry Needling, Electrical stimulation, Cryotherapy, Moist heat, Taping, Ultrasound, Ionotophoresis '4mg'$ /ml Dexamethasone, Manual therapy, and Re-evaluation  PLAN FOR NEXT SESSION: Review and perform HEP.  STM to plantar fascia and calf.  Standing calf stretch at wall. Foot intrinsics.     Selinda Michaels III PT, DPT 08/12/21 1:14 PM

## 2021-08-12 ENCOUNTER — Encounter (HOSPITAL_BASED_OUTPATIENT_CLINIC_OR_DEPARTMENT_OTHER): Payer: Self-pay | Admitting: Physical Therapy

## 2021-08-19 ENCOUNTER — Ambulatory Visit (HOSPITAL_BASED_OUTPATIENT_CLINIC_OR_DEPARTMENT_OTHER): Payer: No Typology Code available for payment source | Admitting: Physical Therapy

## 2021-08-19 ENCOUNTER — Encounter (HOSPITAL_BASED_OUTPATIENT_CLINIC_OR_DEPARTMENT_OTHER): Payer: Self-pay | Admitting: Physical Therapy

## 2021-08-19 DIAGNOSIS — M79672 Pain in left foot: Secondary | ICD-10-CM | POA: Diagnosis not present

## 2021-08-19 DIAGNOSIS — M25672 Stiffness of left ankle, not elsewhere classified: Secondary | ICD-10-CM

## 2021-08-19 DIAGNOSIS — M6281 Muscle weakness (generalized): Secondary | ICD-10-CM

## 2021-08-19 NOTE — Therapy (Addendum)
OUTPATIENT PHYSICAL THERAPY LOWER EXTREMITY TREATMENT   Patient Name: [name] MRN: [id number] DOB:Oct 03, 1970, 51 y.o., female Today's Date: 08/19/2021   PT End of Session - 08/19/21 0852     Visit Number 2    Number of Visits 8    Date for PT Re-Evaluation 09/15/21    Authorization Type AETNA    PT Start Time 0853    PT Stop Time 0929    PT Time Calculation (min) 36 min    Activity Tolerance Patient tolerated treatment well    Behavior During Therapy [facility] for tasks assessed/performed             Past Medical History:  Diagnosis Date   Abrasion of right knee 03/04/2016   Allergy    runny nose   Anxiety    Hypertension    with pregnancy- preeclampsia ONLY    Papilloma of right breast 02/2016   TMJ syndrome    Past Surgical History:  Procedure Laterality Date   BREAST LUMPECTOMY WITH RADIOACTIVE SEED LOCALIZATION Right 03/10/2016   Procedure: RIGHT BREAST LUMPECTOMY WITH RADIOACTIVE SEED LOCALIZATION;  Surgeon: Autumn Messing III, MD;  Location: [facility];  Service: General;  Laterality: Right;   CESAREAN SECTION  03/08/2004   TOE SURGERY Right    age 27   There are no problems to display for this patient.   PCP: Shon Baton, MD  REFERRING PROVIDER: Pedro Earls, MD     THERAPY DIAG:  Pain in left foot  Stiffness of left ankle, not elsewhere classified  Muscle weakness (generalized)  Rationale for Evaluation and Treatment Rehabilitation  ONSET DATE: Late January/Early February 2023  SUBJECTIVE:   SUBJECTIVE STATEMENT: I took some inflammation medicine 2 nights ago- RX from last year for my knee. It helped for the moment.    PERTINENT HISTORY: none  PAIN:  Are you having pain? no  PRECAUTIONS: None  WEIGHT BEARING RESTRICTIONS No  FALLS:  Has patient fallen in last 6 months? No   OCCUPATION: works in [place], primarily sedentary  PLOF: Independent; Pt was able to run and perform her normal functional mobility skills  without L foot pain.   PATIENT GOALS improve pain, continue to run   OBJECTIVE:   DIAGNOSTIC FINDINGS: Pt denies having any x rays.  PATIENT SURVEYS:  LEFS 73.5/80  COGNITION:  Overall cognitive status: Within functional limits for tasks assessed       PALPATION: Pt had no tenderness in palpation t/o ankle, foot, heel, and plantar fascia Pt has tightness in bilat plantar fascia worse on L.   LOWER EXTREMITY ROM:  AROM/PROM Right eval Left eval  Hip flexion    Hip extension    Hip abduction    Hip adduction    Hip internal rotation    Hip external rotation    Knee flexion    Knee extension    Ankle dorsiflexion 8 4/12  Ankle plantarflexion 72 75  Ankle inversion 40 50  Ankle eversion 10 8/7   (Blank rows = not tested)  LOWER EXTREMITY MMT:  MMT Right eval Left eval  Hip flexion    Hip extension    Hip abduction    Hip adduction    Hip internal rotation    Hip external rotation    Knee flexion    Knee extension    Ankle dorsiflexion 5/5 5/5  Ankle plantarflexion WFL, tested in sitting WFL, tested in sitting  Ankle inversion 5/5 5/5  Ankle eversion 5/5 4/5   (  Blank rows = not tested)   GAIT: Assistive device utilized: None Level of assistance: Complete Independence Comments: Pt ambulates with a normalized heel to toe gait pattern without limping.     TODAY'S TREATMENT: 6/14: MANUAL: IASTM plantar fascia, lateral 5th met, met head transverse ligament SLS- mirror for visual stability SLS with mini squat- activating hip abductors Lateral step down    PATIENT EDUCATION:  Education details: objective findings, HEP, POC, exercise rationale, and exercise form.  Instructed pt to continue using roller at home.  Person educated: Patient Education method: Explanation, Demonstration, Tactile cues, Verbal cues, and Handouts Education comprehension: verbalized understanding, returned demonstration, verbal cues required, tactile cues required, and needs  further education   HOME EXERCISE PROGRAM: Access Code: [email or web address] URL: [email or web address]   ASSESSMENT:  CLINICAL IMPRESSION: Notable lack of control of Rt abductor group in CKC leading to abnormal foot strike on left when running.    OBJECTIVE IMPAIRMENTS decreased activity tolerance, decreased ROM, decreased strength, hypomobility, impaired flexibility, and pain.   ACTIVITY LIMITATIONS  running  PARTICIPATION LIMITATIONS:  workout activities   REHAB POTENTIAL: Good  CLINICAL DECISION MAKING: Stable/uncomplicated  EVALUATION COMPLEXITY: Low   GOALS:  SHORT TERM GOALS: Target date: 09/01/2021  Pt will be independent and compliant with HEP for improved pain, strength, ROM, and function.  Baseline: Goal status: INITIAL  2.  Pt will demo at least 12 deg of L ankle DF AROM for improved stiffness and mobility Baseline:  Goal status: INITIAL  3.  Pt will report at least a 25% improvement in pain and sx's overall. Baseline:  Goal status: INITIAL   LONG TERM GOALS: Target date: 09/22/2021  Pt will demo improved eversion strength to 5/5 MMT for improved tolerance with and performance of workout activities including running. Baseline:  Goal status: INITIAL  2.  Pt will report she is able to perform her normal workout activities including running without significant pain. Baseline:  Goal status: INITIAL  3.  Pt will report at least a 75% improvement in her pain at night and stiffness in the AM.  Baseline:  Goal status: INITIAL     PLAN: PT FREQUENCY: 1-2x/week  PT DURATION: 5 weeks  PLANNED INTERVENTIONS: Therapeutic exercises, Therapeutic activity, Neuromuscular re-education, Balance training, Gait training, Patient/Family education, Joint mobilization, Stair training, Aquatic Therapy, Dry Needling, Electrical stimulation, Cryotherapy, Moist heat, Taping, Ultrasound, Ionotophoresis 82m/ml Dexamethasone, Manual therapy, and Re-evaluation  PLAN  FOR NEXT SESSION: continue manual PRN. Cont CKC proximal and distal stability   Avin Gibbons C. Sherian Valenza PT, DPT 08/19/21 11:26 AM  PHYSICAL THERAPY DISCHARGE SUMMARY  Visits from Start of Care: 2  Current functional level related to goals / functional outcomes: See above   Remaining deficits: See above   Education / Equipment: Anatomy of condition, POC, HEP, exercise form/rationale    Patient agrees to discharge. Patient goals were not met. Patient is being discharged due to not returning since the last visit. Kishaun Erekson C. Tammra Pressman PT, DPT 12/25/21 11:58 AM

## 2021-08-26 ENCOUNTER — Ambulatory Visit (HOSPITAL_BASED_OUTPATIENT_CLINIC_OR_DEPARTMENT_OTHER): Payer: No Typology Code available for payment source | Admitting: Physical Therapy

## 2021-09-02 ENCOUNTER — Ambulatory Visit (HOSPITAL_BASED_OUTPATIENT_CLINIC_OR_DEPARTMENT_OTHER): Payer: No Typology Code available for payment source | Admitting: Physical Therapy

## 2021-11-18 ENCOUNTER — Ambulatory Visit: Payer: No Typology Code available for payment source | Admitting: Podiatry

## 2021-11-18 ENCOUNTER — Ambulatory Visit: Payer: No Typology Code available for payment source

## 2021-11-18 DIAGNOSIS — Q666 Other congenital valgus deformities of feet: Secondary | ICD-10-CM

## 2021-11-18 DIAGNOSIS — M79672 Pain in left foot: Secondary | ICD-10-CM

## 2021-11-18 NOTE — Progress Notes (Signed)
Subjective:  Patient ID: [name], female    DOB: October 26, 1970,  MRN: [id number]  Chief Complaint  Patient presents with   Foot Pain    Left foot pain  Pt stated she was seen at [facility] and had xrays done there     51 y.o. female presents with the above complaint.  Patient presents with complaint bilateral flatfoot deformity.  Patient states that she been [facility] had x-rays done there.  She is having some left foot pain mostly in the arches.  She states she is active on her foot.  She wanted get it evaluated she has not seen anyone else prior to seeing me 5 out of 10 pain scale dull achy in nature.   Review of Systems: Negative except as noted in the HPI. Denies N/V/F/Ch.  Past Medical History:  Diagnosis Date   Abrasion of right knee 03/04/2016   Allergy    runny nose   Anxiety    Hypertension    with pregnancy- preeclampsia ONLY    Papilloma of right breast 02/2016   TMJ syndrome     Current Outpatient Medications:    Apoaequorin (PREVAGEN PO), Take by mouth. 3-4 x a week (Patient not taking: Reported on 04/01/2020), Disp: , Rfl:    Ascorbic Acid (VITAMIN C) 500 MG CAPS, Take by mouth as needed. 3 times a week, Disp: , Rfl:    ASHWAGANDHA PO, Take by mouth daily. (Patient not taking: Reported on 04/01/2020), Disp: , Rfl:    Cholecalciferol (VITAMIN D-3) 1000 units CAPS, Take 1 capsule by mouth 3 (three) times a week., Disp: , Rfl:    citalopram (CELEXA) 10 MG tablet, Take 10 mg by mouth daily. (Patient not taking: Reported on 08/12/2021), Disp: , Rfl:    eszopiclone (LUNESTA) 1 MG TABS tablet, Take 1 mg by mouth at bedtime as needed for sleep. Take immediately before bedtime (Patient not taking: Reported on 04/01/2020), Disp: , Rfl:    Misc Natural Products (APPLE CIDER VINEGAR DIET PO), Take by mouth. 1-2 gummies daily (Patient not taking: Reported on 04/01/2020), Disp: , Rfl:    Multiple Vitamin (MULTIVITAMIN) tablet, Take 1 tablet by mouth as directed. Once a week,  Disp: , Rfl:    vitamin B-12 (CYANOCOBALAMIN) 100 MCG tablet, Take 100 mcg by mouth daily. Patient takes 3-4 times a week. (Patient not taking: Reported on 08/12/2021), Disp: , Rfl:   Social History   Tobacco Use  Smoking Status Never  Smokeless Tobacco Never    No Known Allergies Objective:  There were no vitals filed for this visit. There is no height or weight on file to calculate BMI. Constitutional Well developed. Well nourished.  Vascular Dorsalis pedis pulses palpable bilaterally. Posterior tibial pulses palpable bilaterally. Capillary refill normal to all digits.  No cyanosis or clubbing noted. Pedal hair growth normal.  Neurologic Normal speech. Oriented to person, place, and time. Epicritic sensation to light touch grossly present bilaterally.  Dermatologic Nails well groomed and normal in appearance. No open wounds. No skin lesions.  Orthopedic: Gait examination shows pes planovalgus foot deformity with calcaneovalgus to many toe signs partially able to recruit the arch with dorsiflexion of the hallux.  Calcaneus return to neutral position and single and double heel raise   Radiographs: None Assessment:   1. Pes planovalgus    Plan:  Patient was evaluated and treated and all questions answered.  Bilateral pes planovalgus -Pes planovalgus -I explained to patient the etiology of pes planovalgus and relationship with arch  and various treatment options were discussed.  Given patient foot structure in the setting of arch pain I believe patient will benefit from custom-made orthotics to help control the hindfoot motion support the arch of the foot and take the stress away from plantar fascial.  Patient agrees with the plan like to proceed with orthotics -Patient was casted for orthotics    No follow-ups on file.

## 2021-12-09 ENCOUNTER — Ambulatory Visit (INDEPENDENT_AMBULATORY_CARE_PROVIDER_SITE_OTHER): Payer: No Typology Code available for payment source | Admitting: Podiatry

## 2021-12-09 DIAGNOSIS — Q666 Other congenital valgus deformities of feet: Secondary | ICD-10-CM

## 2021-12-09 NOTE — Progress Notes (Signed)
Patient presents today to pick up custom molded foot orthotics recommended by Dr. Posey Pronto.   Orthotics were dispensed and fit was satisfactory. Reviewed instructions for break-in and wear. Written instructions given to patient.  Patient will follow up as needed.   [facility] - order # [id number]

## 2022-09-19 NOTE — Progress Notes (Signed)
NEW PATIENT Date of Service/Encounter:  09/20/22 Referring provider: Creola Corn, MD Primary care provider: Creola Corn, MD  Subjective:  [name] is a 52 y.o. female presenting today for evaluation of chronic cough and congestion. History obtained from: chart review and patient.   Chronic rhinitis and cough:  started in adulthood Symptoms include: congestion, dry cough and mucus, felt like a severe cold, sinus pressure and headaches, watery eyes  Occurs seasonally-spring and fall , this spring was really bad Treatments tried: OTC-zyrtec, benadryl PRN, z-packs PRN Previous allergy testing: no History of reflux/heartburn:  yes-doesn't treat regularly, using OTC reflux medications-omeprazole around once per month, feels her food gets stuck sometimes, this happens multiple times per month, no specific food triggers   Previous sinus, ear, tonsil, adenoid surgeries: no  Never required an inhaler for coughing. She is not getting prescribed steroids.  She does feel that after eating some jalapeno lime corn tortillas that she has increased reflux symptoms.   Chart Review:  PCP records from referral 08/09/22: "chronic sinus congestion and cough-refer to allergy"  Other allergy screening: Food allergy: no Medication allergy: no Hymenoptera allergy: no Eczema:no Urticaria: no History of recurrent infections suggestive of immunodeficency: no Vaccinations are up to date.   Past Medical History: Past Medical History:  Diagnosis Date   Abrasion of right knee 03/04/2016   Allergy    runny nose   Anxiety    Hypertension    with pregnancy- preeclampsia ONLY    Papilloma of right breast 02/2016   TMJ syndrome    Medication List:  Current Outpatient Medications  Medication Sig Dispense Refill   Ascorbic Acid (VITAMIN C) 500 MG CAPS Take by mouth as needed. 3 times a week     Azelastine-Fluticasone 137-50 MCG/ACT SUSP Place 1 spray into the nose 2 (two) times daily as  needed. 23 g 5   Cholecalciferol (VITAMIN D-3) 1000 units CAPS Take 1 capsule by mouth 3 (three) times a week.     Multiple Vitamin (MULTIVITAMIN) tablet Take 1 tablet by mouth as directed. Once a week     omeprazole (PRILOSEC) 40 MG capsule Take 1 capsule (40 mg total) by mouth daily. take 30 minutes prior to morning meals, do for 6 weeks 30 capsule 1   UNABLE TO FIND Med Name: Medi- weight loss fat burner.     UNABLE TO FIND Med Name: Olly gummies     UNABLE TO FIND Med Name: 1st Phorm Cortisol     vitamin B-12 (CYANOCOBALAMIN) 100 MCG tablet Take 100 mcg by mouth daily. Patient takes 3-4 times a week.     Apoaequorin (PREVAGEN PO) Take by mouth. 3-4 x a week (Patient not taking: Reported on 04/01/2020)     ASHWAGANDHA PO Take by mouth daily. (Patient not taking: Reported on 04/01/2020)     citalopram (CELEXA) 10 MG tablet Take 10 mg by mouth daily. (Patient not taking: Reported on 08/12/2021)     eszopiclone (LUNESTA) 1 MG TABS tablet Take 1 mg by mouth at bedtime as needed for sleep. Take immediately before bedtime (Patient not taking: Reported on 04/01/2020)     Misc Natural Products (APPLE CIDER VINEGAR DIET PO) Take by mouth. 1-2 gummies daily (Patient not taking: Reported on 04/01/2020)     No current facility-administered medications for this visit.   Known Allergies:  No Known Allergies Past Surgical History: Past Surgical History:  Procedure Laterality Date   BREAST LUMPECTOMY WITH RADIOACTIVE SEED LOCALIZATION Right 03/10/2016   Procedure:  RIGHT BREAST LUMPECTOMY WITH RADIOACTIVE SEED LOCALIZATION;  Surgeon: Chevis Pretty III, MD;  Location: [facility];  Service: General;  Laterality: Right;   CESAREAN SECTION  03/08/2004   TOE SURGERY Right    age 73   Family History: Family History  Problem Relation Age of Onset   Pulmonary embolism Mother    Heart disease Father    Colon polyps Father    Urticaria Sister    Melanoma Sister    Colon cancer Neg Hx    Esophageal  cancer Neg Hx    Rectal cancer Neg Hx    Stomach cancer Neg Hx    Social History: [name] lives in a house built 21 years ago, no water damage, carpet in the bedroom, Architectural technologist, central AC, indoor dog, no roaches, no smoke exposure, works as Marketing executive relations x 28 years, + HEPA filter in the home, [place].   ROS:  All other systems negative except as noted per HPI.  Objective:  Blood pressure 112/68, pulse 70, temperature 98.4 F (36.9 C), temperature source Temporal, resp. rate 16, height 5\' 2"  (1.575 m), weight 166 lb 8 oz (75.5 kg), SpO2 100%. Body mass index is 30.45 kg/m. Physical Exam:  General Appearance:  Alert, cooperative, no distress, appears stated age  Head:  Normocephalic, without obvious abnormality, atraumatic  Eyes:  Conjunctiva clear, EOM's intact  Nose: Nares normal, hypertrophic turbinates, normal mucosa, no visible anterior polyps, and septum midline  Throat: Lips, tongue normal; teeth and gums normal, normal posterior oropharynx and + cobblestoning  Neck: Supple, symmetrical  Lungs:   clear to auscultation bilaterally, Respirations unlabored, no coughing  Heart:  regular rate and rhythm and no murmur, Appears well perfused  Extremities: No edema  Skin: Skin color, texture, turgor normal and no rashes or lesions on visualized portions of skin  Neurologic: No gross deficits     Diagnostics: Skin Testing: Environmental allergy panel and select foods. Adequate positive and negative controls. Results discussed with patient/family.  Airborne Adult Perc - 09/20/22 1000     Time Antigen Placed 1000    Allergen Manufacturer Greer    Location Back    Number of Test 55    1. Control-Buffer 50% Glycerol Negative    2. Control-Histamine 3+    3. Bahia Negative    4. French Southern Territories Negative    5. Johnson Negative    6. Kentucky Blue Negative    7. Meadow Fescue Negative    8. Perennial Rye Negative    9. Timothy Negative     10. Ragweed Mix Negative    11. Cocklebur Negative    12. Plantain,  English Negative    13. Baccharis Negative    14. Dog Fennel Negative    15. Russian Thistle Negative    16. Lamb's Quarters Negative    17. Sheep Sorrell Negative    18. Rough Pigweed Negative    19. Marsh Elder, Rough Negative    20. Mugwort, Common Negative    21. Box, Elder Negative    22. Cedar, red Negative    23. Sweet Gum Negative    24. Pecan Pollen Negative    25. Pine Mix Negative    26. Walnut, Black Pollen Negative    27. Red Mulberry Negative    28. Ash Mix Negative    29. Birch Mix Negative    30. Beech American Negative    31. Cottonwood, Guinea-Bissau Negative    32. Hickory,  White 2+    33. Maple Mix Negative    34. Oak, Guinea-Bissau Mix Negative    35. Sycamore Eastern Negative    36. Alternaria Alternata Negative    37. Cladosporium Herbarum Negative    38. Aspergillus Mix Negative    39. Penicillium Mix Negative    40. Bipolaris Sorokiniana (Helminthosporium) Negative    41. Drechslera Spicifera (Curvularia) Negative    42. Mucor Plumbeus Negative    43. Fusarium Moniliforme Negative    44. Aureobasidium Pullulans (pullulara) Negative    45. Rhizopus Oryzae Negative    46. Botrytis Cinera Negative    47. Epicoccum Nigrum Negative    48. Phoma Betae Negative    49. Dust Mite Mix Negative    50. Cat Hair 10,000 BAU/ml Negative    51.  Dog Epithelia Negative    52. Mixed Feathers Negative    53. Horse Epithelia Negative    54. Cockroach, German Negative    55. Tobacco Leaf Negative             13 Food Perc - 09/20/22 1000       Test Information   Time Antigen Placed 1001    Allergen Manufacturer Greer    Location Back    Number of allergen test 13             Intradermal - 09/20/22 1100     Time Antigen Placed 1050    Allergen Manufacturer Greer    Location Arm    Number of Test 15    Control Negative    Bahia Negative    French Southern Territories Negative    Johnson Negative    7  Grass Negative    Ragweed Mix Negative    Weed Mix Negative    Mold 1 Negative    Mold 2 Negative    Mold 3 Negative    Mold 4 Negative    Mite Mix Negative    Cat Negative    Dog Negative             Allergy testing results were read and interpreted by myself, documented by clinical staff.  Assessment and Plan  I suspect that a lot of her current symptoms are being driven by uncontrolled reflux. I am sending her to gastroenterology due to history of dysphagia. We discussed differentials including uncontrolled reflux, EoE, esophageal motility issues, etc.  She does have borderline positivity to hickory tree pollen which may explain some of her springtime symptoms, but would not explain fall.  Plan as below.  Chronic Rhinitis: determined to be mixed with mostly nonallergic component : - allergy testing today: borderline to hickory tree only, negative to intradermal testing - Prevention:  - allergen avoidance when possible - Symptom control: - Start dymista nasal spray 1 spray each nostril twice daily-use daily during spring and fall, as needed the rest of the year. - Continue Antihistamine: daily or daily as needed.   -Options include Zyrtec (Cetirizine) 10mg , Claritin (Loratadine) 10mg , Allegra (Fexofenadine) 180mg , or Xyzal (Levocetirinze) 5mg  - Can be purchased over-the-counter if not covered by insurance.  Reflux and dysphagia- - lifestyle and diet modifications - start omeprazole 40 mg daily- take 30 minutes prior to morning meals, do for 6 weeks - referral to gastroenterology (Dr. Bosie Clos) - food allergy testing to most common allergens negative  Follow up : 6 months, sooner if needed It was a pleasure meeting you in clinic today! Thank you for allowing me to participate in your care.  This note in its entirety was forwarded to the Provider who requested this consultation.  Thank you for your kind referral. I appreciate the opportunity to take part in [name]'s  care. Please do not hesitate to contact me with questions.  Sincerely,  Tonny Bollman, MD [facility]

## 2022-09-20 ENCOUNTER — Ambulatory Visit: Payer: No Typology Code available for payment source | Admitting: Internal Medicine

## 2022-09-20 ENCOUNTER — Encounter: Payer: Self-pay | Admitting: Internal Medicine

## 2022-09-20 ENCOUNTER — Other Ambulatory Visit: Payer: Self-pay

## 2022-09-20 VITALS — BP 112/68 | HR 70 | Temp 98.4°F | Resp 16 | Ht 62.0 in | Wt 166.5 lb

## 2022-09-20 DIAGNOSIS — T781XXA Other adverse food reactions, not elsewhere classified, initial encounter: Secondary | ICD-10-CM | POA: Diagnosis not present

## 2022-09-20 DIAGNOSIS — R053 Chronic cough: Secondary | ICD-10-CM

## 2022-09-20 DIAGNOSIS — J3089 Other allergic rhinitis: Secondary | ICD-10-CM

## 2022-09-20 DIAGNOSIS — K219 Gastro-esophageal reflux disease without esophagitis: Secondary | ICD-10-CM | POA: Diagnosis not present

## 2022-09-20 DIAGNOSIS — J310 Chronic rhinitis: Secondary | ICD-10-CM

## 2022-09-20 MED ORDER — OMEPRAZOLE 40 MG PO CPDR: 40.0000 mg | DELAYED_RELEASE_CAPSULE | Freq: Every day | ORAL | 1 refills | Status: DC

## 2022-09-20 MED ORDER — AZELASTINE-FLUTICASONE 137-50 MCG/ACT NA SUSP: 1.0000 | Freq: Two times a day (BID) | NASAL | 5 refills | Status: AC | PRN

## 2022-09-20 NOTE — Patient Instructions (Addendum)
Chronic Rhinitis: determined to be mixed with mostly nonallergic component : - allergy testing today: borderline to hickory tree only, negative to intradermal testing - Prevention:  - allergen avoidance when possible - Symptom control: - Start dymista nasal spray 1 spray each nostril twice daily-use daily during spring and fall, as needed the rest of the year. - Continue Antihistamine: daily or daily as needed.   -Options include Zyrtec (Cetirizine) 10mg , Claritin (Loratadine) 10mg , Allegra (Fexofenadine) 180mg , or Xyzal (Levocetirinze) 5mg  - Can be purchased over-the-counter if not covered by insurance.  Reflux and dysphagia- - lifestyle and diet modifications - start omeprazole 40 mg daily- take 30 minutes prior to morning meals, do for 6 weeks - referral to gastroenterology (Dr. Bosie Clos) - food allergy testing to most common allergens negative  Follow up : 6 months, sooner if needed It was a pleasure meeting you in clinic today! Thank you for allowing me to participate in your care.  Tonny Bollman, MD [facility] Reducing Pollen Exposure  The American Academy of Allergy, Asthma and Immunology suggests the following steps to reduce your exposure to pollen during allergy seasons.    Do not hang sheets or clothing out to dry; pollen may collect on these items. Do not mow lawns or spend time around freshly cut grass; mowing stirs up pollen. Keep windows closed at night.  Keep car windows closed while driving. Minimize morning activities outdoors, a time when pollen counts are usually at their highest. Stay indoors as much as possible when pollen counts or humidity is high and on windy days when pollen tends to remain in the air longer. Use air conditioning when possible.  Many air conditioners have filters that trap the pollen spores. Use a HEPA room air filter to remove pollen form the indoor air you breathe. Gastroesophageal Reflux Induced Respiratory Disease and  Laryngopharyngeal Reflux (LPR): Gastroesophageal reflux disease (GERD) is a condition where the contents of the stomach reflux or back up into the esophagus or swallowing tube.  This can result in a variety of clinical symptoms including classic symptoms and atypical symptoms.  Classic symptoms of GERD include: heartburn, chest pain, acid taste in the mouth, and difficulty in swallowing.  Atypical symptoms of GERD include laryngopharyngeal reflux (LPR) and asthma.  LPR occurs when stomach reflux comes all the way up to the throat.  Clinical symptoms include hoarseness, raspy voice, laryngitis, throat clearing, postnasal drip, mucus stuck in the throat, a sensation of a lump in the throat, sore throat, and cough.  Most patients with LPR do not have classic symptoms of GERD.  Asthma can also be triggered by GERD.  The acid stomach fluid can stimulate nerve fibers in the esophagus which can cause an increase in bronchial muscle tone and narrowing of the airways.  Acid stomach contents may also reflux into the trachea and bronchi of the lungs where it can trigger an asthma attack.  Many people with GERD triggered asthma do not have classic symptoms of GERD.  Diagnosis of LPR and GERD induced asthma is frequently made from a typical history and response to medications.  It may take several months of medications to see a good response.  Occasionally, a 24-hour esophageal pH probe study must be performed.  Treatment of GERD/LPR includes:   Modification of diet and lifestyle Stop smoking Avoid overeating and lose weight Avoid acidic and fatty foods, chocolate, onions, garlic, peppermint Elevate the head of your bed 6 to 8 inches with blocks or  wedge Medications Zantac, Pepcid, Axid, Tagamet Prilosec, Prevacid, Aciphex, Protonix, Nexium Surgery

## 2022-09-21 NOTE — Progress Notes (Signed)
Patient called and states she does not want referral going to Dr. Bosie Clos. She saw Dr. Rob Bunting for her colonoscopy and would like to see him for any gastroenterology care. Patient wants referral sent to Dr. Christella Hartigan.

## 2022-09-24 ENCOUNTER — Telehealth: Payer: Self-pay

## 2022-09-24 NOTE — Telephone Encounter (Signed)
-----   Message from Pemberwick L sent at 09/21/2022 11:54 AM EDT -----    ----- Message ----- From: Salley Hews, Dannielle Burn Sent: 09/21/2022  11:45 AM EDT To: [facility] [employer]  Patient called and states she does not want referral going to Dr. Bosie Clos. She saw Dr. Rob Bunting for her colonoscopy and would like to see him for any gastroenterology care. Patient wants referral sent to Dr. Christella Hartigan.

## 2022-09-24 NOTE — Telephone Encounter (Signed)
Referral has been placed to Dr. Christella Hartigan office. Patient has been informed and will reach out to their office to get scheduled.   Thanks

## 2022-10-07 ENCOUNTER — Encounter: Payer: Self-pay | Admitting: Physician Assistant

## 2022-10-12 ENCOUNTER — Other Ambulatory Visit: Payer: Self-pay | Admitting: Internal Medicine

## 2022-12-23 ENCOUNTER — Encounter: Payer: Self-pay | Admitting: Physician Assistant

## 2022-12-23 ENCOUNTER — Ambulatory Visit: Payer: No Typology Code available for payment source | Admitting: Physician Assistant

## 2022-12-23 VITALS — BP 104/80 | HR 80 | Ht 62.0 in | Wt 163.2 lb

## 2022-12-23 DIAGNOSIS — R1314 Dysphagia, pharyngoesophageal phase: Secondary | ICD-10-CM

## 2022-12-23 DIAGNOSIS — K219 Gastro-esophageal reflux disease without esophagitis: Secondary | ICD-10-CM | POA: Diagnosis not present

## 2022-12-23 DIAGNOSIS — R1310 Dysphagia, unspecified: Secondary | ICD-10-CM | POA: Diagnosis not present

## 2022-12-23 MED ORDER — OMEPRAZOLE 40 MG PO CPDR: 40.0000 mg | DELAYED_RELEASE_CAPSULE | Freq: Every day | ORAL | 1 refills | Status: AC

## 2022-12-23 NOTE — Addendum Note (Signed)
Addended by: Mariane Duval on: 12/23/2022 11:17 AM   Modules accepted: Orders

## 2022-12-23 NOTE — Progress Notes (Addendum)
Chief Complaint: Dysphagia, GERD  HPI:    [name] is a 52 y/o African-American female with a past medical history as listed below, previously known to Dr. Christella Hartigan, who was referred to me by Verlee Monte, MD for a complaint of GERD and dysphagia.      09/26/2019 colonoscopy for millimeter polyps in the ascending colon and cecum.  Pathology showed hyperplastic polyps and repeat recommended 10 years.    Today, patient presents to clinic and tells me over the past 6 months she has been noticing an increase in "discomfort" in her esophagus.  Tells me it happens mainly at night or sometimes after she is eating.  Also sometimes feels like something is constantly sitting there in her throat or stuck.  Sometimes foods seem like they get stuck on the way down.  She was taking over-the-counter Omeprazole as needed which was working okay but still having some symptoms this was increased to Omeprazole 40 mg once daily by her allergy physician in late July and she has been taking that as needed which does seem to help with her symptoms when she has them.  She has never tried taking this medicine on a daily basis because she wants to know what she is treating.    Denies fever, chills, weight loss, change in bowel habits or abdominal pain.  Past Medical History:  Diagnosis Date   Abrasion of right knee 03/04/2016   Allergy    runny nose   Anxiety    Colon polyps    Hypertension    with pregnancy- preeclampsia ONLY    Papilloma of right breast 02/2016   TMJ syndrome     Past Surgical History:  Procedure Laterality Date   BREAST LUMPECTOMY WITH RADIOACTIVE SEED LOCALIZATION Right 03/10/2016   Procedure: RIGHT BREAST LUMPECTOMY WITH RADIOACTIVE SEED LOCALIZATION;  Surgeon: Chevis Pretty III, MD;  Location: [facility];  Service: General;  Laterality: Right;   CESAREAN SECTION  03/08/2004   TOE SURGERY Right    age 44    Current Outpatient Medications  Medication Sig Dispense Refill    Ascorbic Acid (VITAMIN C) 500 MG CAPS Take by mouth as needed. 3 times a week     Azelastine-Fluticasone 137-50 MCG/ACT SUSP Place 1 spray into the nose 2 (two) times daily as needed. 23 g 5   B Complex Vitamins (VITAMIN-B COMPLEX PO) Take 1 capsule by mouth as needed.     chlorhexidine (PERIDEX) 0.12 % solution Use as directed 15 mLs in the mouth or throat 2 (two) times daily.     Cholecalciferol (VITAMIN D-3) 1000 units CAPS Take 1 capsule by mouth 3 (three) times a week.     Multiple Vitamin (MULTIVITAMIN) tablet Take 1 tablet by mouth as directed. Once a week     omeprazole (PRILOSEC) 40 MG capsule TAKE 1 CAPSULE (40 MG TOTAL) BY MOUTH DAILY. TAKE 30 MINUTES PRIOR TO MORNING MEALS, DO FOR 6 WEEKS 90 capsule 1   Probiotic Product (PROBIOTIC DAILY PO) Take 1 capsule by mouth as needed.     vitamin B-12 (CYANOCOBALAMIN) 100 MCG tablet Take 100 mcg by mouth as needed. Patient takes 3-4 times a week.     No current facility-administered medications for this visit.    Allergies as of 12/23/2022   (No Known Allergies)    Family History  Problem Relation Age of Onset   Pulmonary embolism Mother    Heart disease Father    Colon polyps Father  Diabetes Father    Melanoma Maternal Grandmother    Melanoma Paternal Grandmother    Colon cancer Neg Hx    Esophageal cancer Neg Hx    Rectal cancer Neg Hx    Stomach cancer Neg Hx     Social History   Socioeconomic History   Marital status: Single    Spouse name: Not on file   Number of children: 1   Years of education: Not on file   Highest education level: Not on file  Occupational History   Occupation: Production designer, theatre/television/film  Tobacco Use   Smoking status: Never    Passive exposure: Past   Smokeless tobacco: Never  Vaping Use   Vaping status: Never Used  Substance and Sexual Activity   Alcohol use: Yes    Comment: occasionally 2 times a month   Drug use: No   Sexual activity: Not on file  Other Topics Concern   Not on file  Social History  Narrative   Not on file   Social Determinants of Health   Financial Resource Strain: Not on file  Food Insecurity: Not on file  Transportation Needs: Not on file  Physical Activity: Not on file  Stress: Not on file  Social Connections: Not on file  Intimate Partner Violence: Not on file    Review of Systems:    Constitutional: No weight loss, fever or chills Skin: No rash Cardiovascular: No chest pain  Respiratory: No SOB  Gastrointestinal: See HPI and otherwise negative Genitourinary: No dysuria  Neurological: No headache, dizziness or syncope Musculoskeletal: No new muscle or joint pain Hematologic: No bleeding  Psychiatric: No history of depression or anxiety   Physical Exam:  Vital signs: BP 104/80 (BP Location: Left Arm, Patient Position: Sitting, Cuff Size: Normal)   Pulse 80   Ht 5\' 2"  (1.575 m) Comment: height measured without shoes  Wt 163 lb 4 oz (74 kg)   BMI 29.86 kg/m   Constitutional:   Pleasant AA female appears to be in NAD, Well developed, Well nourished, alert and cooperative Head:  Normocephalic and atraumatic. Eyes:   PEERL, EOMI. No icterus. Conjunctiva pink. Ears:  Normal auditory acuity. Neck:  Supple Throat: Oral cavity and pharynx without inflammation, swelling or lesion.  Respiratory: Respirations even and unlabored. Lungs clear to auscultation bilaterally.   No wheezes, crackles, or rhonchi.  Cardiovascular: Normal S1, S2. No MRG. Regular rate and rhythm. No peripheral edema, cyanosis or pallor.  Gastrointestinal:  Soft, nondistended, nontender. No rebound or guarding. Normal bowel sounds. No appreciable masses or hepatomegaly. Rectal:  Not performed.  Msk:  Symmetrical without gross deformities. Without edema, no deformity or joint abnormality.  Neurologic:  Alert and  oriented x4;  grossly normal neurologically.  Skin:   Dry and intact without significant lesions or rashes. Psychiatric: Demonstrates good judgement and reason without  abnormal affect or behaviors.  No recent labs or imaging.  Assessment: 1.  GERD: Omeprazole 40 mg as needed mostly happens at night ; likely gastritis +/- GERD  2.  Dysphagia: To solids occasionally with occasional globus sensation; related to above  Plan: 1.  Patient asks to be scheduled with Dr. Lavon Paganini.  She was scheduled for an EGD with possible dilation in the [facility] as patient scheduled out.  Did provide the patient a detailed list of risks for the procedure and she agrees to proceed. Patient is appropriate for endoscopic procedure(s) in the ambulatory ([facility]) setting.  2.  Recommend the patient take her Omeprazole 40 mg 30  minutes before dinner daily for the next 4 to 6 weeks.  Sent in a new prescription #30 with 2 refills. 3.  Patient to follow in clinic per recommendations after time of procedure above.  Hyacinth Meeker, PA-C [facility] 12/23/2022, 10:47 AM  Cc: Verlee Monte, MD   Addendum:  After I left the room the patient to read the consent for EGD and decided not to schedule at this time.  She wanted to go home and speak with her husband first.  Hyacinth Meeker, PA-C

## 2022-12-23 NOTE — Patient Instructions (Addendum)
We have sent the following medications to your pharmacy for you to pick up at your convenience: Omeprazole 40 mg daily 30 minutes before dinner.   It has been recommended to you by your physician that you have a(n) upper endoscopy completed. Per your request, we did not schedule the procedure(s) today. Please contact our office at [phone] should you decide to have the procedure completed. You will be scheduled for a pre-visit and procedure at that time.  If your blood pressure at your visit was 140/90 or greater, please contact your primary care physician to follow up on this. _______________________________________________________  If you are age 53 or older, your body mass index should be between 23-30. Your Body mass index is 29.86 kg/m. If this is out of the aforementioned range listed, please consider follow up with your Primary Care Provider.  If you are age 50 or younger, your body mass index should be between 19-25. Your Body mass index is 29.86 kg/m. If this is out of the aformentioned range listed, please consider follow up with your Primary Care Provider.   ________________________________________________________  The  GI providers would like to encourage you to use [facility] to communicate with providers for non-urgent requests or questions.  Due to long hold times on the telephone, sending your provider a message by [facility] may be a faster and more efficient way to get a response.  Please allow 48 business hours for a response.  Please remember that this is for non-urgent requests.  _______________________________________________________

## 2023-01-31 ENCOUNTER — Telehealth: Payer: Self-pay

## 2023-01-31 NOTE — Telephone Encounter (Signed)
Patient wanting a referral to [facility] with [facility] physicians since her pervious GI Dr. Christella Hartigan has retired from [employer]. Patient did see a PA  from [place] back in October about 6 wks ago and patient stated she didn't feel comfortable and wanted a new referral.  I spoke with chander with [facility]'s Dr. Bosie Clos will have to review her records prior to him taking her on as a new patient. Patient was instructed to go by [facility] to sign a release of information for that office to fax notes to Dr. Marge Duncans office and pt. to call her insurance to see if she can do self referral to the new GI and if not she will call our office again. Patient states she is a good friend of Dr. Marge Duncans family. The fax number to Dr. Marge Duncans office was given to the patient [phone].

## 2023-02-10 ENCOUNTER — Encounter: Payer: No Typology Code available for payment source | Admitting: Gastroenterology

## 2023-02-22 ENCOUNTER — Telehealth: Payer: Self-pay

## 2023-02-22 NOTE — Telephone Encounter (Signed)
Pts old gi is retiring and needs a referral to [facility], Dr Loreta Ave. Thank you

## 2023-02-23 NOTE — Telephone Encounter (Signed)
Please send referral, thanks! Also, her old GI can make referral since they are currently responsible for her GI care.

## 2023-03-11 ENCOUNTER — Encounter: Payer: Self-pay | Admitting: Internal Medicine

## 2023-03-11 NOTE — Telephone Encounter (Signed)
 I placed a referral to [facility] with Dr. Renaye Sous.  I tried to place an internal referral but the referral kept changing to incoming.  I printed all correspondence and manually faxed the referral to their office.  I have tried to call [facility] several times to confirm this is the provider she wanted since there was another doctor listed in another telephone contact, but she kept sending me to voicemail and I was unable to leave any messages due to her mailbox being full.  I tried to send a letter to her MyChart and she has not accessed MyChart since June.  I will mail a letter to her address letting her know we filed her referral.  I will also let her know in that letter to please have her records from [facility] sent to [facility].    [facility] [address], [address] , [place], [place] [address] Phone: [phone] Fax: [phone]

## 2023-03-11 NOTE — Telephone Encounter (Signed)
 [name]   Can you help me out with this referral?  Thanks

## 2023-03-24 NOTE — Telephone Encounter (Signed)
Awesome!

## 2023-03-24 NOTE — Telephone Encounter (Signed)
I received correspondence from Dr. Loreta Ave at [facility] and they state [facility] has an appointment on 03/28/2023 @ 1:15pm.

## 2023-03-28 ENCOUNTER — Ambulatory Visit: Payer: No Typology Code available for payment source | Admitting: Internal Medicine

## 2023-07-11 ENCOUNTER — Telehealth: Payer: Self-pay

## 2023-07-11 NOTE — Telephone Encounter (Signed)
 [place]ceived on base communication from [employer] regarding patient of Dr Cornel Diesel to verify that we are prescribing provider for her omeprazole . Yes we sent in 90day supply on 12/23/2022. Faxed back to [phone]

## 2023-08-18 ENCOUNTER — Ambulatory Visit: Payer: No Typology Code available for payment source | Admitting: Dermatology

## 2023-08-18 ENCOUNTER — Encounter: Payer: Self-pay | Admitting: Dermatology

## 2023-08-18 VITALS — BP 141/97

## 2023-08-18 DIAGNOSIS — L816 Other disorders of diminished melanin formation: Secondary | ICD-10-CM | POA: Diagnosis not present

## 2023-08-18 DIAGNOSIS — L814 Other melanin hyperpigmentation: Secondary | ICD-10-CM

## 2023-08-18 DIAGNOSIS — L819 Disorder of pigmentation, unspecified: Secondary | ICD-10-CM | POA: Diagnosis not present

## 2023-08-18 DIAGNOSIS — Z808 Family history of malignant neoplasm of other organs or systems: Secondary | ICD-10-CM

## 2023-08-18 DIAGNOSIS — L818 Other specified disorders of pigmentation: Secondary | ICD-10-CM

## 2023-08-18 NOTE — Progress Notes (Signed)
 New Patient Visit   Subjective  [name] is a 53 y.o. female who presents for the following: New Pt - Sun Spots  Patient states she has sun spots located at the lower legs that have been there for 10 years that she would like examined. Areas are not bothersome but have spread. Patient has previously been treated for these areas by a dermatologist who performed Bx that proved hypopigmentation. Patient reports family history of skin cancer(s).(Paternal & maternal grandmothers - melanoma)   The following portions of the chart were reviewed this encounter and updated as appropriate: medications, allergies, medical history  Review of Systems:  No other skin or systemic complaints except as noted in HPI or Assessment and Plan.  Objective  Well appearing patient in no apparent distress; mood and affect are within normal limits.   A focused examination was performed of the following areas: B/L legs   Relevant exam findings are noted in the Assessment and Plan.               Assessment & Plan   1. Idiopathic Guttate Hypomelanosis (IGH) - Assessment: Patient presents with white spots on her legs, previously biopsied by another dermatologist with results showing hypopigmentation. Based on the patient's history of [place] travel and sun exposure in her youth, as well as the clinical appearance, these lesions are consistent with Idiopathic Guttate Hypomelanosis (IGH). This condition is associated with aging and sun exposure, where melanocytes become fatigued and produce less pigment in certain areas. The lesions are described as drop-like and are not expected to grow or develop into vitiligo.  - Plan:    Continue current sun protection regimen:     - Use of AltaMD and CeraVe SPF 30 products    Patient education:     - Emphasize importance of consistent sunscreen application, even on cloudy days or when indoors near windows     - Recommend exfoliation before applying self-tanner  if patient chooses to use it for cosmetic improvement    No specific treatment required for IGH lesions as they are benign  2. Lentigenes - Assessment: Patient reports noticing small, darker spots appearing on her body over the winter. Upon examination, these are determined to be benign age-related pigmentation changes, colloquially referred to as wisdom spots. No concerning features were noted during the skin examination.  - Plan:    Reassure patient about the benign nature of these lesions    Continue current sun protection measures to prevent worsening    Monitor for any changes in size, shape, or color of existing lesions  3. Localized Hyperpigmentation on Hand - Assessment: Patient has noticed darkening of a specific area on her hand over the past month. 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 No new medications or underlying health conditions reported. Good capillary refill and blood flow observed. The hyperpigmentation is likely sun-related, despite limited sun exposure.  - Plan:    Recommend Excedrin Radiant Tone system for lightening:     - Apply dual active serum morning and night     - Use sunscreen with active ingredients during the day     - Apply night cream in the evening    Provide samples of all three Excedrin Radiant Tone products    Patient education:     - Educate on proper application and expectations (results may take months)    Recommend use of mineral sunscreen for intense sun exposure:     -  Suggest Neutrogena mineral sunscreen options     - Provide information on specific products in after-visit summary    Follow up in 6 months to assess progress    No follow-ups on file.   Documentation: I have reviewed the above documentation for accuracy and completeness, and I agree with the above.   I, Shirron Louanne Roussel, CMA, am acting as scribe for [employer], DO.   Louana Roup, DO

## 2023-08-18 NOTE — Patient Instructions (Addendum)
 Date: Thu Aug 18 2023  Hello [name],  Thank you for visiting today. Here is a summary of the key instructions:  - Sun Protection:   - Use sunscreen daily, even on cloudy days   - Apply sunscreen to exposed legs and hands   - For intense sun exposure (beach, lake, field day):     - Use a mineral sunscreen     - Try Neutrogena mineral sunscreen for face and hands  - Skin Care:   - Continue using CeraVe SPF 30 for face during the day   - Use CeraVe PM for face at night   - Try Eucerin Radiant Tone lightening cream for hands:     - Apply dual active serum morning and night     - Use sunscreen with medicine in the morning     - Use nighttime cream with medicine at night   - Exfoliate before applying sunless tanner  - Follow-up:   - Schedule appointment in 6 months to check progress of hand pigmentation  Please reach out if you have any questions or concerns.  Warm regards,  Dr. Louana Roup, Dermatology Important Information  Due to recent changes in healthcare laws, you may see results of your pathology and/or laboratory studies on MyChart before the doctors have had a chance to review them. We understand that in some cases there may be results that are confusing or concerning to you. Please understand that not all results are received at the same time and often the doctors may need to interpret multiple results in order to provide you with the best plan of care or course of treatment. Therefore, we ask that you please give us  2 business days to thoroughly review all your results before contacting the office for clarification. Should we see a critical lab result, you will be contacted sooner.   If You Need Anything After Your Visit  If you have any questions or concerns for your doctor, please call our main line at [phone] If no one answers, please leave a voicemail as directed and we will return your call as soon as possible. Messages left after 4 pm will be answered the  following business day.   You may also send us  a message via MyChart. We typically respond to MyChart messages within 1-2 business days.  For prescription refills, please ask your pharmacy to contact our office. Our fax number is [phone].  If you have an urgent issue when the clinic is closed that cannot wait until the next business day, you can page your doctor at the number below.    Please note that while we do our best to be available for urgent issues outside of office hours, we are not available 24/7.   If you have an urgent issue and are unable to reach us , you may choose to seek medical care at your doctor's office, retail clinic, urgent care center, or emergency room.  If you have a medical emergency, please immediately call 911 or go to the emergency department. In the event of inclement weather, please call our main line at [phone] for an update on the status of any delays or closures.  Dermatology Medication Tips: Please keep the boxes that topical medications come in in order to help keep track of the instructions about where and how to use these. Pharmacies typically print the medication instructions only on the boxes and not directly on the medication tubes.   If your medication is too expensive, please  contact our office at [phone] or send us  a message through MyChart.   We are unable to tell what your co-pay for medications will be in advance as this is different depending on your insurance coverage. However, we may be able to find a substitute medication at lower cost or fill out paperwork to get insurance to cover a needed medication.   If a prior authorization is required to get your medication covered by your insurance company, please allow us  1-2 business days to complete this process.  Drug prices often vary depending on where the prescription is filled and some pharmacies may offer cheaper prices.  The website www.goodrx.com contains coupons for  medications through different pharmacies. The prices here do not account for what the cost may be with help from insurance (it may be cheaper with your insurance), but the website can give you the price if you did not use any insurance.  - You can print the associated coupon and take it with your prescription to the pharmacy.  - You may also stop by our office during regular business hours and pick up a GoodRx coupon card.  - If you need your prescription sent electronically to a different pharmacy, notify our office through [facility] or by phone at [phone]

## 2024-02-20 ENCOUNTER — Encounter: Payer: Self-pay | Admitting: Dermatology

## 2024-02-20 ENCOUNTER — Ambulatory Visit: Admitting: Dermatology

## 2024-02-20 VITALS — BP 151/93 | HR 80

## 2024-02-20 DIAGNOSIS — L231 Allergic contact dermatitis due to adhesives: Secondary | ICD-10-CM | POA: Diagnosis not present

## 2024-02-20 DIAGNOSIS — L816 Other disorders of diminished melanin formation: Secondary | ICD-10-CM

## 2024-02-20 DIAGNOSIS — L818 Other specified disorders of pigmentation: Secondary | ICD-10-CM

## 2024-02-20 DIAGNOSIS — L814 Other melanin hyperpigmentation: Secondary | ICD-10-CM

## 2024-02-20 NOTE — Patient Instructions (Addendum)
 VISIT SUMMARY:  Today, we discussed the skin reaction you experienced from using adhesive patches with castor oil around your belly button. You have since stopped using the patches and are treating the affected area with Eucerin Radiant Tone, vitamin C oil, and retinol cream. We also talked about your interest in finding a sunscreen that doubles as a moisturizer for your hands.  YOUR PLAN:  -CONTACT DERMATITIS DUE TO ADHESIVE:  Contact dermatitis is a skin reaction caused by contact with a substance that irritates the skin or causes an allergic reaction. In your case, the reaction was due to adhesive patches with castor oil.   You should continue to avoid using these patches. You can apply Eucerin Radiant Tone to the affected area to help lighten the mark, and consider using vitamin C oil and retinol cream for additional treatment.  -POSTINFLAMMATORY HYPERPIGMENTATION: Postinflammatory hyperpigmentation is a condition where the skin becomes darker after an inflammatory reaction.   This happened on your abdomen following the contact dermatitis. You should continue using Eucerin Radiant Tone, vitamin C oil, and retinol cream to address the pigmentation. We expect the pigmentation to resolve within 2-3 months, and we will reassess it in September to ensure it is improving.  INSTRUCTIONS:  We will reassess the pigmentation on your abdomen in September to ensure it is resolving as expected. In the meantime, continue using Eucerin Radiant Tone, vitamin C oil, and retinol cream as discussed.         Important Information  Due to recent changes in healthcare laws, you may see results of your pathology and/or laboratory studies on MyChart before the doctors have had a chance to review them. We understand that in some cases there may be results that are confusing or concerning to you. Please understand that not all results are received at the same time and often the doctors may need to interpret  multiple results in order to provide you with the best plan of care or course of treatment. Therefore, we ask that you please give us  2 business days to thoroughly review all your results before contacting the office for clarification. Should we see a critical lab result, you will be contacted sooner.   If You Need Anything After Your Visit  If you have any questions or concerns for your doctor, please call our main line at [phone] If no one answers, please leave a voicemail as directed and we will return your call as soon as possible. Messages left after 4 pm will be answered the following business day.   You may also send us  a message via MyChart. We typically respond to MyChart messages within 1-2 business days.  For prescription refills, please ask your pharmacy to contact our office. Our fax number is [phone].  If you have an urgent issue when the clinic is closed that cannot wait until the next business day, you can page your doctor at the number below.    Please note that while we do our best to be available for urgent issues outside of office hours, we are not available 24/7.   If you have an urgent issue and are unable to reach us , you may choose to seek medical care at your doctor's office, retail clinic, urgent care center, or emergency room.  If you have a medical emergency, please immediately call 911 or go to the emergency department. In the event of inclement weather, please call our main line at [phone] for an update on the status of any delays  or closures.  Dermatology Medication Tips: Please keep the boxes that topical medications come in in order to help keep track of the instructions about where and how to use these. Pharmacies typically print the medication instructions only on the boxes and not directly on the medication tubes.   If your medication is too expensive, please contact our office at [phone] or send us  a message through MyChart.   We are  unable to tell what your co-pay for medications will be in advance as this is different depending on your insurance coverage. However, we may be able to find a substitute medication at lower cost or fill out paperwork to get insurance to cover a needed medication.   If a prior authorization is required to get your medication covered by your insurance company, please allow us  1-2 business days to complete this process.  Drug prices often vary depending on where the prescription is filled and some pharmacies may offer cheaper prices.  The website www.goodrx.com contains coupons for medications through different pharmacies. The prices here do not account for what the cost may be with help from insurance (it may be cheaper with your insurance), but the website can give you the price if you did not use any insurance.  - You can print the associated coupon and take it with your prescription to the pharmacy.  - You may also stop by our office during regular business hours and pick up a GoodRx coupon card.  - If you need your prescription sent electronically to a different pharmacy, notify our office through [facility] or by phone at [phone]

## 2024-02-20 NOTE — Progress Notes (Signed)
" ° °  Follow-Up Visit  Patient (and/or pt guardian) consented to the use of AI-assisted tools for note generation.   Subjective  [name] is a 53 y.o. female who presents for the following: Idiopathic guttate hypomelanosis (IGH) & Localized hyperpigmentation on hand  Patient was last evaluated on 08/18/23.  At this visit patient was advised to continue use of daily sunscreen with AltaMD or CeraVE SPF 30 Recommended to exfoliate before applying self tanner  Patient reports sxs are better. Patient denies medication changes. Patient reports she has been using sunscreen to areas and has also been using an over the counter retinol on her legs and has noticed that there is some improvement with her skin tone  For pigmentation on hands Recommended use of Eucerin Radiant tone serums and sunscreen to use daily Recommended use of mineral sunscreen Patient reports she has been using Eucerin products on hands but not daily and does notice some improvement Patient reports she is also using sunscreen  Patient would also like to discuss area on abdomen that she has been using Castor oil patches on and had noticed some hyperpigmentation in the area   The following portions of the chart were reviewed this encounter and updated as appropriate: medications, allergies, medical history  Review of Systems:  No other skin or systemic complaints except as noted in HPI or Assessment and Plan.  Objective  Well appearing patient in no apparent distress; mood and affect are within normal limits.   A focused examination was performed of the following areas: Left hand and Bilateral lower extremities   Relevant exam findings are noted in the Assessment and Plan.                   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 No pruritus reported. Reaction occurred after multiple exposures, indicating sensitization. No need for steroid cream as there is no significant pruritus. - Discontinued use of adhesive patches. - Apply Eucerin Radiant Tone to affected area for lightening. - Consider using vitamin C oil and retinol cream for additional treatment.  Postinflammatory hyperpigmentation Abdomen following contact dermatitis. No significant inflammation or pruritus. Melanocytes irritated, leading to pigment changes. Early treatment expected to resolve pigmentation within 2-3 months. - Continue using Eucerin Radiant Tone for lightening. - Use vitamin C oil and retinol cream to address pigmentation. - Will reassess pigmentation in September to ensure resolution.   Return in about 9 months (around 11/20/2024) for Contact dermatitis/IGH/hyperpigmentation F/U.  [name], am acting as a neurosurgeon for [employer], DO .   Documentation: I have reviewed the above documentation for accuracy and completeness, and I agree with the above.  Delon Lenis, DO   "
